# Patient Record
Sex: Male | Born: 1985
Health system: Southern US, Community
[De-identification: ages and names within clinical notes are randomized; demographics above are authoritative.]

## PROBLEM LIST (undated history)

## (undated) DIAGNOSIS — I10 Essential (primary) hypertension: Secondary | ICD-10-CM

---

## 2014-05-07 ENCOUNTER — Encounter (HOSPITAL_COMMUNITY): Payer: Self-pay | Admitting: Emergency Medicine

## 2014-05-07 ENCOUNTER — Emergency Department (HOSPITAL_COMMUNITY)
Admission: EM | Admit: 2014-05-07 | Discharge: 2014-05-07 | Disposition: A | Discharge status: Home / Self Care | Payer: BLUE CROSS/BLUE SHIELD | Attending: Emergency Medicine | Admitting: Emergency Medicine

## 2014-05-07 DIAGNOSIS — N390 Urinary tract infection, site not specified: Secondary | ICD-10-CM | POA: Diagnosis not present

## 2014-05-07 DIAGNOSIS — K6289 Other specified diseases of anus and rectum: Secondary | ICD-10-CM | POA: Insufficient documentation

## 2014-05-07 DIAGNOSIS — R300 Dysuria: Secondary | ICD-10-CM | POA: Diagnosis present

## 2014-05-07 LAB — URINALYSIS, ROUTINE W REFLEX MICROSCOPIC
Glucose, UA: NEGATIVE mg/dL
Ketones, ur: 15 mg/dL — AB
Nitrite: NEGATIVE
Protein, ur: 30 mg/dL — AB
SPECIFIC GRAVITY, URINE: 1.034 — AB (ref 1.005–1.030)
UROBILINOGEN UA: 1 mg/dL (ref 0.0–1.0)
pH: 6 (ref 5.0–8.0)

## 2014-05-07 LAB — URINE MICROSCOPIC-ADD ON

## 2014-05-07 MED ORDER — CEFTRIAXONE SODIUM 250 MG IJ SOLR
250.0000 mg | Freq: Once | INTRAMUSCULAR | Status: AC
  Administered 2014-05-07: 250 mg via INTRAMUSCULAR
  Filled 2014-05-07: qty 250

## 2014-05-07 MED ORDER — AZITHROMYCIN 250 MG PO TABS
1000.0000 mg | ORAL_TABLET | Freq: Once | ORAL | Status: AC
  Administered 2014-05-07: 1000 mg via ORAL
  Filled 2014-05-07: qty 4

## 2014-05-07 MED ORDER — PHENAZOPYRIDINE HCL 200 MG PO TABS: 200.0000 mg | ORAL_TABLET | Freq: Three times a day (TID) | ORAL | Status: DC

## 2014-05-07 MED ORDER — LIDOCAINE HCL (PF) 1 % IJ SOLN
1.0000 mL | Freq: Once | INTRAMUSCULAR | Status: AC
  Administered 2014-05-07: 1 mL
  Filled 2014-05-07: qty 5

## 2014-05-07 MED ORDER — CEPHALEXIN 500 MG PO CAPS: 500.0000 mg | ORAL_CAPSULE | Freq: Four times a day (QID) | ORAL | Status: DC

## 2014-05-07 NOTE — Discharge Instructions (Signed)
Please follow directions provided. Be sure to follow up with the urologist if your symptoms do not improve. Use the resource guide or the referral given to establish care with a primary care doctor. Be sure to drink plenty of water to stay well hydrated. Please take the antibiotic until it is all gone. Please use the Pyridium as needed for painful urination. Don't hesitate to return for any new, worsening, or concerning symptoms.   SEEK IMMEDIATE MEDICAL CARE IF:  You have severe back pain or lower abdominal pain.  You develop chills.  You have nausea or vomiting.  You have continued burning or discomfort with urination.    Emergency Department Resource Guide 1) Find a Doctor and Pay Out of Pocket Although you won't have to find out who is covered by your insurance plan, it is a good idea to ask around and get recommendations. You will then need to call the office and see if the doctor you have chosen will accept you as a new patient and what types of options they offer for patients who are self-pay. Some doctors offer discounts or will set up payment plans for their patients who do not have insurance, but you will need to ask so you aren't surprised when you get to your appointment.  2) Contact Your Local Health Department Not all health departments have doctors that can see patients for sick visits, but many do, so it is worth a call to see if yours does. If you don't know where your local health department is, you can check in your phone book. The CDC also has a tool to help you locate your state's health department, and many state websites also have listings of all of their local health departments.  3) Find a Walk-in Clinic If your illness is not likely to be very severe or complicated, you may want to try a walk in clinic. These are popping up all over the country in pharmacies, drugstores, and shopping centers. They're usually staffed by nurse practitioners or physician assistants that have  been trained to treat common illnesses and complaints. They're usually fairly quick and inexpensive. However, if you have serious medical issues or chronic medical problems, these are probably not your best option.  No Primary Care Doctor: - Call Health Connect at  [phone] - they can help you locate a primary care doctor that  accepts your insurance, provides certain services, etc. - Physician Referral Service- [phone]  Chronic Pain Problems: Organization         Address  Phone   Notes  [facility]  [phone] Patients need to be referred by their primary care doctor.   Medication Assistance: Organization         Address  Phone   Notes  [facility] [address] [place], [place] [id number] [phone] --Must be a resident of [place] -- Must have NO insurance coverage whatsoever (no Medicaid/ Medicare, etc.) -- The pt. MUST have a primary care doctor that directs their care regularly and follows them in the community   MedAssist  [phone]   [employer]  [phone]    Agencies that provide inexpensive medical care: Organization         Address  Phone   Notes  [facility]  [phone]   [facility]    [phone]   [facility] [address] [place], [place]  [id number] [phone]   [place] [address] [place] [phone]   Planned Parenthood    [phone]   [facility]    [phone]   [place] and [place] [address], [place] Phone:  [phone], Fax:  [phone] Hours of Operation:  9 am - 6 pm, M-F.  Also accepts Medicaid/Medicare and self-pay.  [facility] for [address], [address], [place] Phone: [phone], Fax: [phone]. Hours of Operation:  8:30 am - 5:30 pm, M-F.  Also accepts Medicaid and  self-pay.  [facility] [place] [address] [place] Phone: [phone]   [place], [place], [place] [phone], Ext. [phone] Mondays & Thursdays: 7-9 AM.  First 15 patients are seen on a first come, first serve basis.    [place]inn Providers:  Organization         Address  Phone   Notes  [facility] [address],  [phone] Also accepts self-pay patients.  [facility] [id number] [place], [place]  [phone]   [place], [address], [place] [phone]   [facility] [address], [place] [phone]   Lucianne Lei [address], [place]   [phone] Only accepts [place] Access [place] patients after they have their name applied to their card.   Self-Pay (no insurance) in [facility]:  Organization         Address  Phone   Notes  Sickle Cell Patients, [facility] [place] [phone]   [facility]gent Care [place] [phone]   [facility] [place]  [place], [place], [place] [phone]   [facility]/Dr. Osei-Bonsu  [address] [place] or [address], [place] [phone] Phone number for both [place] and [place] locations is the same.  Urgent Medical and [facility] [address], [place] [phone]   [facility] [address] [place] or [address] [phone]   [facility] [address] [place] [phone], phone; [phone], fax Sees patients 1st and 3rd Saturday of every month.  Must not qualify for public or private insurance (i.e. Medicaid, Medicare, [place] Health Choice, Veterans' Benefits)  Household income  should be no more than 200% of the poverty level The clinic cannot treat you if you are pregnant or think you are pregnant  Sexually transmitted diseases are not treated at the clinic.    Dental Care: Organization         Address  Phone  Notes  [facility] of [facility] [phone] Accepts children up to age 53 who are enrolled in [place] or [place]; pregnant women with a Medicaid card; and children who have applied for Medicaid or [place] Health Choice, but were declined, whose parents can pay a reduced fee at time of service.  [facility] Department of [facility]  [address], [place] [phone] Accepts children up to age 65 who are enrolled in [place] or [place]; pregnant women  with a Medicaid card; and children who have applied for Medicaid or [place] Health Choice, but were declined, whose parents can pay a reduced fee at time of service.  [place] Adult Dental Access PROGRAM  [place] [phone] Patients are seen by appointment only. Walk-ins are not accepted. [place] will see patients 21 years of age and older. Monday - Tuesday (8am-5pm) Most Wednesdays (8:30-5pm) $30 per visit, cash only  [facility] Adult Dental Access PROGRAM  [address], [facility] [phone] Patients are seen by appointment only. Walk-ins are not accepted. [place] will see patients 26 years of age and older. One Wednesday Evening (Monthly: Volunteer Based).  $30 per visit, cash only  [place]  [phone] for adults; Children under age 40, call Graduate Pediatric Dentistry at [phone]. Children aged 65-14, please call [phone] to request a pediatric application.  Dental services are provided in all areas of dental care including fillings, crowns and bridges, complete and partial dentures, implants, gum treatment,  root canals, and extractions. Preventive care is also provided. Treatment is provided to both adults and children. Patients are selected via a lottery and there is often a waiting list.   [facility] [address] [place]  [phone] [email or web address]   [facility] [address] [place], [place] [phone], Ext. [phone] Second and Fourth Thursday of each month, opens at 6:30 AM; Clinic ends at 9 AM.  Patients are seen on a first-come first-served basis, and a limited number are seen during each clinic.   [facility]  [address] Hillard Danker [place], [place] [phone]   Eligibility Requirements You must have lived in [place], [place], or [place] counties for at least the last three months.   You cannot be eligible for state or federal sponsored [employer], including [employer], [place], or [employer].   You generally cannot be eligible for healthcare insurance through your employer.    How to apply: Eligibility screenings are held every Tuesday and Wednesday afternoon from 1:00 pm until 4:00 pm. You do not need an appointment for the interview!  [facility] [address], [place], [place]   [place]  [place] Department  [place]  [phone]    Behavioral Health Resources in the Community: Intensive Outpatient Programs Organization         Address  Phone  Notes  [facility] [place]. [address] [place], [place] [phone]   [facility] Outpatient [address], [place], [place]   ADS: Alcohol [facility] Drug S[facility] [address] [place], [place]   [place] [address]  [place], [place] or [phone]   Substance Abuse Resources Organization         Address  Phone  Notes  Alcohol and Drug Services   [phone]   [place]  [phone]   [facility] [place]   Chinita Pester  [phone]   Residential & Outpatient Substance Abuse Program  [phone]   Psychological Services Organization         Address  Phone  Notes  [facility] [place]  [place]  [phone]   [place] [address] [place] or [phone]    Mobile Crisis Teams  Organization         Address  Phone  Notes  [facility], Mobile Crisis Care Unit  [phone]   [facility]  [address] [place], [place]   [facility] [address], [place] [phone]    Self-Help/Support Groups Organization         Address  Phone             Notes  [place]. of [place] - variety of support groups  [place] Call for more information  Narcotics Anonymous (NA), [facility] [address], [employer] [place]  2 meetings at this location   Special educational needs teacher         Address  Phone  Notes  ASAP Residential Treatment [place],    [place]  [phone]   [facility]  [address] [place] [phone], [place], [place]   Gem Brady, [place] [phone] Admissions: 8am-3pm M-F  Incentives Substance [place] [address]    [place], [place] [phone]   [facility] [address] [place], [place], [place]   [facility] [address],  [place], [place]   [facility] Programs - Intensive Outpatient [address] Krist[address], [place], [place]   [facility] [address].,  [place], [place] [phone] or [phone]   [facility] (RTS) [address], [place], [place] Accepts Medicaid  [facility] [place] [address],  [place] [phone] Substance Abuse/Addiction Treatment   [facility] Organization         Address  Phone  Notes  [facility]  [phone]   Domenic Schwab, PhD [address] Arlis Porta [place], [place]   [phone] or [phone]   [place], [place] [phone]   [facility] [address] [place], [place] [phone] Insurance/Medicaid/sponsorship through [facility] and Families [address]                                    [place], [place] [phone] [place] [address] [place], [place] [phone]    Dr. Adele Schilder  [phone]   [facility] Dept. 1) [address], [place] 2) [place] 3)  [address], [place] [phone]  [phone]   [place] [phone] or [phone] (After Hours)

## 2014-05-07 NOTE — ED Provider Notes (Signed)
CSN: [id number]     Arrival date & time 05/07/14  2002 History  This chart was scribed for non-physician practitioner, Harle BattiestElizabeth Terreon Ekholm, NP, working with Shon Batonourtney F Horton, MD, by Bronson CurbJacqueline Melvin, ED Scribe. This patient was seen in room TR08C/TR08C and the patient's care was started at 8:59 PM.   Chief Complaint  Patient presents with  . Urinary Tract Infection    The history is provided by the patient. No language interpreter was used.     HPI Comments: Wesley Watson is a 29 y.o. male who presents to the Emergency Department complaining of sudden onset painful urination for the past 2 days. There is associated generalized headache, subjective fever, chills. He notes occasional rectal pain with BMs that is gradually improving. Patient also notes  intermittent lower back pain, however, he states the pain is very mild and possibly related to work. Patient had takenTylenol and Nyquil in order to sleep at night. He denies STD exposure. He denies abdominal pain, nausea, vomiting, penile discharge, or testicular pain.   History reviewed. No pertinent past medical history. History reviewed. No pertinent past surgical history. No family history on file. History  Substance Use Topics  . Smoking status: Never Smoker   . Smokeless tobacco: Not on file  . Alcohol Use: No    Review of Systems  Gastrointestinal: Positive for rectal pain. Negative for nausea, vomiting and abdominal pain.  Genitourinary: Positive for dysuria. Negative for discharge and testicular pain.  Musculoskeletal: Positive for back pain.      Allergies  Review of patient's allergies indicates no known allergies.  Home Medications   Prior to Admission medications   Medication Sig Start Date End Date Taking? Authorizing Provider  acetaminophen (TYLENOL) 500 MG tablet Take 1,000 mg by mouth every 6 (six) hours as needed for mild pain.   Yes Historical Provider, MD  Pseudoeph-Doxylamine-DM-APAP (NYQUIL PO) Take 30 mLs  by mouth daily as needed (congestion / sleep).   Yes Historical Provider, MD   Triage Vitals: BP 139/112 mmHg  Pulse 81  Temp(Src) 98.1 F (36.7 C)  Resp 16  SpO2 100%  Physical Exam  Constitutional: He is oriented to person, place, and time. He appears well-developed and well-nourished. No distress.  HENT:  Head: Normocephalic and atraumatic.  Eyes: Conjunctivae and EOM are normal.  Neck: Neck supple. No tracheal deviation present.  Cardiovascular: Normal rate.   Pulmonary/Chest: Effort normal. No respiratory distress.  Abdominal: There is no CVA tenderness.  No CVA tenderness.  Genitourinary: Testes normal and penis normal. Right testis shows no tenderness. Left testis shows no tenderness. Uncircumcised. No discharge found.  Not circumcised, but normal penis. No discharge. No testicular tenderness. No lymphadenopathy.   Musculoskeletal: Normal range of motion.  Lymphadenopathy:       Right: No inguinal adenopathy present.       Left: No inguinal adenopathy present.  Neurological: He is alert and oriented to person, place, and time.  Skin: Skin is warm and dry.  Psychiatric: He has a normal mood and affect. His behavior is normal.  Nursing note and vitals reviewed.   ED Course  Procedures (including critical care time)  DIAGNOSTIC STUDIES: Oxygen Saturation is 100% on room air, normal by my interpretation.    COORDINATION OF CARE: At 2104 Discussed treatment plan with patient which includes UA and possible urology f/u. Patient agrees.   Labs Review Labs Reviewed  URINALYSIS, ROUTINE W REFLEX MICROSCOPIC    Imaging Review No results found.   EKG  Interpretation None      MDM   Final diagnoses:  UTI (lower urinary tract infection)   29 yo with dysuria but no report of testicular pain or penile discharge.  Pt denies possible STD exposure. His urine shows leukocytes and 21-50 WBC. Pt covered in ED for STD, but will be treated for UTI. He is afebrile, no CVA  tenderness, normotensive, and denies N/V. Pt to be dc home with antibiotics and instructions to follow up with urology if symptoms persist. Pt aware of plan and in agreement.   I personally performed the services described in this documentation, which was scribed in my presence. The recorded information has been reviewed and is accurate.  Filed Vitals:   05/07/14 2020 05/07/14 2036 05/07/14 2249  BP: 139/112 141/86 142/85  Pulse: 81  80  Temp: 98.1 F (36.7 C)  97.4 F (36.3 C)  TempSrc:   Oral  Resp: 16  16  SpO2: 100%  99%   Meds given in ED:  Medications  cefTRIAXone (ROCEPHIN) injection 250 mg (250 mg Intramuscular Given 05/07/14 2239)  azithromycin (ZITHROMAX) tablet 1,000 mg (1,000 mg Oral Given 05/07/14 2238)  lidocaine (PF) (XYLOCAINE) 1 % injection 1 mL (1 mL Other Given 05/07/14 2238)    Discharge Medication List as of 05/07/2014 10:46 PM    START taking these medications   Details  cephALEXin (KEFLEX) 500 MG capsule Take 1 capsule (500 mg total) by mouth 4 (four) times daily., Starting 05/07/2014, Until Discontinued, Print    phenazopyridine (PYRIDIUM) 200 MG tablet Take 1 tablet (200 mg total) by mouth 3 (three) times daily., Starting 05/07/2014, Until Discontinued, Print          Harle Battiest, NP 05/09/14 7829  Shon Baton, MD 05/12/14 Ernestina [place]

## 2014-05-07 NOTE — ED Notes (Signed)
Pt. reports dysuria and concentrated urine onset last week with low grade fever and mild headache .

## 2014-05-07 NOTE — ED Notes (Signed)
Pt c/o burning on urination since Monday. Also c/o headache and fever. Has been taking Tylenol for headache and fever.

## 2019-04-15 ENCOUNTER — Encounter (HOSPITAL_COMMUNITY): Payer: Self-pay

## 2019-04-15 ENCOUNTER — Ambulatory Visit (HOSPITAL_COMMUNITY)
Admission: EM | Admit: 2019-04-15 | Discharge: 2019-04-15 | Disposition: A | Discharge status: Home / Self Care | Payer: No Typology Code available for payment source | Attending: Family Medicine | Admitting: Family Medicine

## 2019-04-15 DIAGNOSIS — I10 Essential (primary) hypertension: Secondary | ICD-10-CM

## 2019-04-15 DIAGNOSIS — T148XXA Other injury of unspecified body region, initial encounter: Secondary | ICD-10-CM | POA: Diagnosis not present

## 2019-04-15 HISTORY — DX: Essential (primary) hypertension: I10

## 2019-04-15 MED ORDER — AMLODIPINE BESYLATE 5 MG PO TABS: 5.0000 mg | ORAL_TABLET | Freq: Every day | ORAL | 0 refills | Status: DC

## 2019-04-15 NOTE — Discharge Instructions (Addendum)
We removed the splinter from your finger.  Keep clean and watch for signs of infection. We have started you on amlodipine 5 mg please follow-up with primary care for further management of your high blood pressure.

## 2019-04-15 NOTE — ED Triage Notes (Signed)
Pt states yesterday he got a splinter in his left 2nd digit. It's a very small splinter.

## 2019-04-16 NOTE — ED Provider Notes (Addendum)
[facility]    CSN: [id number] Arrival date & time: 04/15/19  9323      History   Chief Complaint Chief Complaint  Patient presents with  . splinter in finger    HPI Wesley Watson is a 34 y.o. male.   Patient is a 34 year old male presents today with splinter in left second digit.  This is located to the tip of his finger.  This occurred yesterday.  Attempted to remove but piece broke off.  He has been cleaning the finger.  Mild pain but no numbness, tingling or decrease in station.  No swelling.  Patient also here for refill on blood pressure medication.  He has not had his medication in approximately 2 months.  Blood pressure 175/120 today.  Recently obtained medical insurance and plans to see a primary care for further management.  Denies any headache, dizziness, blurred vision.   ROS per HPI      Past Medical History:  Diagnosis Date  . Hypertension     There are no problems to display for this patient.   History reviewed. No pertinent surgical history.     Home Medications    Prior to Admission medications   Medication Sig Start Date End Date Taking? Authorizing Provider  acetaminophen (TYLENOL) 500 MG tablet Take 1,000 mg by mouth every 6 (six) hours as needed for mild pain.    [provider]  amLODipine (NORVASC) 5 MG tablet Take 1 tablet (5 mg total) by mouth daily. 04/15/19   Janace Aris, NP    Family History Family History  Problem Relation Age of Onset  . Hypertension Mother     Social History Social History   Tobacco Use  . Smoking status: Never Smoker  Substance Use Topics  . Alcohol use: No  . Drug use: No     Allergies   Patient has no known allergies.   Review of Systems Review of Systems   Physical Exam Triage Vital Signs ED Triage Vitals [04/15/19 0943]  Enc Vitals Group     BP (!) 175/120     Pulse Rate 77     Resp 18     Temp 99 F (37.2 C)     Temp Source Oral     SpO2 98 %     Weight 280  lb (127 kg)     Height 5\' 11"  (1.803 m)     Head Circumference      Peak Flow      Pain Score 0     Pain Loc      Pain Edu?      Excl. in GC?    No data found.  Updated Vital Signs BP (!) 175/120   Pulse 77   Temp 99 F (37.2 C) (Oral)   Resp 18   Ht 5\' 11"  (1.803 m)   Wt 280 lb (127 kg)   SpO2 98%   BMI 39.05 kg/m   Visual Acuity Right Eye Distance:   Left Eye Distance:   Bilateral Distance:    Right Eye Near:   Left Eye Near:    Bilateral Near:     Physical Exam Vitals and nursing note reviewed.  Constitutional:      Appearance: Normal appearance.  HENT:     Head: Normocephalic and atraumatic.     Nose: Nose normal.  Eyes:     Conjunctiva/sclera: Conjunctivae normal.  Pulmonary:     Effort: Pulmonary effort is normal.  Abdominal:  Palpations: Abdomen is soft.     Tenderness: There is no abdominal tenderness.  Musculoskeletal:        General: Normal range of motion.     Cervical back: Normal range of motion.  Skin:    General: Skin is warm and dry.     Comments: Tip of sliver visible to tip of the left second digit.  No bleeding.   Neurological:     Mental Status: He is alert.  Psychiatric:        Mood and Affect: Mood normal.      UC Treatments / Results  Labs (all labs ordered are listed, but only abnormal results are displayed) Labs Reviewed - No data to display  EKG   Radiology No results found.  Procedures Procedures (including critical care time)  Medications Ordered in UC Medications - No data to display  Initial Impression / Assessment and Plan / UC Course  I have reviewed the triage vital signs and the nursing notes.  Pertinent labs & imaging results that were available during my care of the patient were reviewed by me and considered in my medical decision making (see chart for details).     Sliver- tiny piece removed from the finger. Pt tolerated well. Placed abx ointment and Band-Aid. Keep area clean.   Essential  hypertension-  Patient very hypertensive today at 175/120.  He is not having any concerning signs or symptoms on exam Patient has plans to follow-up primary care for blood pressure management.  We will restart him on amlodipine and have him follow-up Recommend monitor blood pressures at home and keep a log Watch sodium intake Contact given for primary care follow-up  Final Clinical Impressions(s) / UC Diagnoses   Final diagnoses:  Sliver  Essential hypertension     Discharge Instructions     We removed the splinter from your finger.  Keep clean and watch for signs of infection. We have started you on amlodipine 5 mg please follow-up with primary care for further management of your high blood pressure.    ED Prescriptions    Medication Sig Dispense Auth. Provider   amLODipine (NORVASC) 5 MG tablet Take 1 tablet (5 mg total) by mouth daily. 30 tablet Loura Halt A, NP     PDMP not reviewed this encounter.   Orvan July, NP 04/16/19 1102    Loura Halt A, NP 04/16/19 1103

## 2019-06-24 ENCOUNTER — Emergency Department (HOSPITAL_COMMUNITY): Payer: No Typology Code available for payment source

## 2019-06-24 ENCOUNTER — Other Ambulatory Visit: Payer: Self-pay

## 2019-06-24 ENCOUNTER — Emergency Department (HOSPITAL_COMMUNITY)
Admission: EM | Admit: 2019-06-24 | Discharge: 2019-06-24 | Disposition: A | Discharge status: Home / Self Care | Payer: No Typology Code available for payment source | Attending: Emergency Medicine | Admitting: Emergency Medicine

## 2019-06-24 ENCOUNTER — Encounter (HOSPITAL_COMMUNITY): Payer: Self-pay

## 2019-06-24 DIAGNOSIS — N201 Calculus of ureter: Secondary | ICD-10-CM | POA: Insufficient documentation

## 2019-06-24 DIAGNOSIS — Z79899 Other long term (current) drug therapy: Secondary | ICD-10-CM | POA: Insufficient documentation

## 2019-06-24 DIAGNOSIS — I10 Essential (primary) hypertension: Secondary | ICD-10-CM | POA: Insufficient documentation

## 2019-06-24 DIAGNOSIS — R1031 Right lower quadrant pain: Secondary | ICD-10-CM | POA: Diagnosis present

## 2019-06-24 LAB — COMPREHENSIVE METABOLIC PANEL
ALT: 27 U/L (ref 0–44)
AST: 27 U/L (ref 15–41)
Albumin: 4.5 g/dL (ref 3.5–5.0)
Alkaline Phosphatase: 55 U/L (ref 38–126)
Anion gap: 11 (ref 5–15)
BUN: 19 mg/dL (ref 6–20)
CO2: 26 mmol/L (ref 22–32)
Calcium: 9.2 mg/dL (ref 8.9–10.3)
Chloride: 100 mmol/L (ref 98–111)
Creatinine, Ser: 1.42 mg/dL — ABNORMAL HIGH (ref 0.61–1.24)
GFR calc Af Amer: >60 mL/min (ref >60)
GFR calc non Af Amer: >60 mL/min (ref >60)
Glucose, Bld: 157 mg/dL — ABNORMAL HIGH (ref 70–99)
Potassium: 4.1 mmol/L (ref 3.5–5.1)
Sodium: 137 mmol/L (ref 135–145)
Total Bilirubin: 0.7 mg/dL (ref 0.3–1.2)
Total Protein: 7.5 g/dL (ref 6.5–8.1)

## 2019-06-24 LAB — URINALYSIS, ROUTINE W REFLEX MICROSCOPIC
Bacteria, UA: NONE SEEN
Bilirubin Urine: NEGATIVE
Glucose, UA: 150 mg/dL — AB
Ketones, ur: 5 mg/dL — AB
Leukocytes,Ua: NEGATIVE
Nitrite: NEGATIVE
Protein, ur: NEGATIVE mg/dL
Specific Gravity, Urine: 1.013 (ref 1.005–1.030)
pH: 8 (ref 5.0–8.0)

## 2019-06-24 LAB — CBC
HCT: 49.2 % (ref 39.0–52.0)
Hemoglobin: 15.7 g/dL (ref 13.0–17.0)
MCH: 26.7 pg (ref 26.0–34.0)
MCHC: 31.9 g/dL (ref 30.0–36.0)
MCV: 83.7 fL (ref 80.0–100.0)
Platelets: 266 10*3/uL (ref 150–400)
RBC: 5.88 MIL/uL — ABNORMAL HIGH (ref 4.22–5.81)
RDW: 13.6 % (ref 11.5–15.5)
WBC: 8.1 10*3/uL (ref 4.0–10.5)
nRBC: 0 % (ref 0.0–0.2)

## 2019-06-24 LAB — LIPASE, BLOOD: Lipase: 25 U/L (ref 11–51)

## 2019-06-24 MED ORDER — HYDROMORPHONE HCL 1 MG/ML IJ SOLN
1.0000 mg | Freq: Once | INTRAMUSCULAR | Status: AC
  Administered 2019-06-24: 1 mg via INTRAVENOUS
  Filled 2019-06-24: qty 1

## 2019-06-24 MED ORDER — SODIUM CHLORIDE 0.9 % IV BOLUS (SEPSIS)
1000.0000 mL | Freq: Once | INTRAVENOUS | Status: AC
  Administered 2019-06-24: 1000 mL via INTRAVENOUS

## 2019-06-24 MED ORDER — HYDROCODONE-ACETAMINOPHEN 5-325 MG PO TABS: 1.0000 | ORAL_TABLET | ORAL | 0 refills | Status: DC | PRN

## 2019-06-24 MED ORDER — ONDANSETRON 8 MG PO TBDP: 8.0000 mg | ORAL_TABLET | Freq: Three times a day (TID) | ORAL | 0 refills | Status: DC | PRN

## 2019-06-24 MED ORDER — SODIUM CHLORIDE 0.9% FLUSH
3.0000 mL | Freq: Once | INTRAVENOUS | Status: AC
  Administered 2019-06-24: 3 mL via INTRAVENOUS

## 2019-06-24 MED ORDER — ONDANSETRON HCL 4 MG/2ML IJ SOLN
4.0000 mg | Freq: Once | INTRAMUSCULAR | Status: AC
Start: 2019-06-24 — End: 2019-06-24
  Administered 2019-06-24: 4 mg via INTRAVENOUS
  Filled 2019-06-24: qty 2

## 2019-06-24 MED ORDER — SODIUM CHLORIDE 0.9 % IV SOLN: 1000.0000 mL | INTRAVENOUS | Status: DC

## 2019-06-24 MED FILL — ONDANSETRON ODT 8 MG TABLET: 8 | 4 days supply | Qty: 12 | Fill #0

## 2019-06-24 NOTE — ED Provider Notes (Signed)
[facility]-EMERGENCY DEPT Provider Note   CSN: [id number] Arrival date & time: 06/24/19  8469     History Chief Complaint  Patient presents with  . Abdominal Pain    Wesley Watson is a 34 y.o. male.  The history is provided by the patient.  Abdominal Pain Pain location:  RLQ Pain quality comment:  Awueezing Pain radiates to:  Does not radiate Pain severity:  Severe Onset quality:  Sudden Duration:  5 hours Timing:  Constant Chronicity:  New Context: not recent illness and not trauma   Relieved by:  Nothing Ineffective treatments:  None tried Associated symptoms: nausea   Associated symptoms: no constipation, no diarrhea, no dysuria, no fever and no vomiting        Past Medical History:  Diagnosis Date  . Hypertension     There are no problems to display for this patient.   History reviewed. No pertinent surgical history.     Family History  Problem Relation Age of Onset  . Hypertension Mother     Social History   Tobacco Use  . Smoking status: Never Smoker  Substance Use Topics  . Alcohol use: No  . Drug use: No    Home Medications Prior to Admission medications   Medication Sig Start Date End Date Taking? Authorizing Provider  acetaminophen (TYLENOL) 500 MG tablet Take 1,000 mg by mouth every 6 (six) hours as needed for mild pain.    [provider]  amLODipine (NORVASC) 5 MG tablet Take 1 tablet (5 mg total) by mouth daily. 04/15/19   Dahlia Byes A, NP  HYDROcodone-acetaminophen (NORCO/VICODIN) 5-325 MG tablet Take 1 tablet by mouth every 4 (four) hours as needed. 06/24/19   Linwood Dibbles, MD  ondansetron (ZOFRAN ODT) 8 MG disintegrating tablet Take 1 tablet (8 mg total) by mouth every 8 (eight) hours as needed for nausea or vomiting. 06/24/19   Linwood Dibbles, MD    Allergies    Patient has no known allergies.  Review of Systems   Review of Systems  Constitutional: Negative for fever.  Gastrointestinal: Positive for  abdominal pain and nausea. Negative for constipation, diarrhea and vomiting.  Genitourinary: Negative for dysuria.    Physical Exam Updated Vital Signs BP 136/80   Pulse 80   Resp 16   SpO2 99%   Physical Exam  ED Results / Procedures / Treatments   Labs (all labs ordered are listed, but only abnormal results are displayed) Labs Reviewed  COMPREHENSIVE METABOLIC PANEL - Abnormal; Notable for the following components:      Result Value   Glucose, Bld 157 (*)    Creatinine, Ser 1.42 (*)    All other components within normal limits  CBC - Abnormal; Notable for the following components:   RBC 5.88 (*)    All other components within normal limits  URINALYSIS, ROUTINE W REFLEX MICROSCOPIC - Abnormal; Notable for the following components:   Color, Urine STRAW (*)    Glucose, UA 150 (*)    Hgb urine dipstick SMALL (*)    Ketones, ur 5 (*)    All other components within normal limits  LIPASE, BLOOD    EKG EKG Interpretation  Date/Time:  Monday June 24 2019 07:05:11 EDT Ventricular Rate:  84 PR Interval:    QRS Duration: 114 QT Interval:  384 QTC Calculation: 454 R Axis:   21 Text Interpretation: Sinus rhythm Prolonged PR interval Borderline intraventricular conduction delay Low voltage, precordial leads ST elev, probable normal  early repol pattern No old tracing to compare Confirmed by Dorie Rank [phone]) on 06/24/2019 9:05:06 AM   Radiology CT Renal Stone Study  Result Date: 06/24/2019 CLINICAL DATA:  Right lower abdominal pain radiating into the back for approximately 2 hours. EXAM: CT ABDOMEN AND PELVIS WITHOUT CONTRAST TECHNIQUE: Multidetector CT imaging of the abdomen and pelvis was performed following the standard protocol without IV contrast. COMPARISON:  None. FINDINGS: Lower chest: Lung bases clear.  No pleural or pericardial effusion. Hepatobiliary: No focal liver abnormality is seen. No gallstones, gallbladder wall thickening, or biliary dilatation. Pancreas:  Unremarkable. No pancreatic ductal dilatation or surrounding inflammatory changes. Spleen: Normal in size without focal abnormality. Adrenals/Urinary Tract: The patient has mild right hydronephrosis with stranding about the right kidney and ureter due to a 0.3 cm stone just inferior to the ureteropelvic junction. Single punctate nonobstructing stones are also seen in each kidney. No left hydronephrosis or ureteral stone. Urinary bladder is unremarkable. The adrenal glands appear normal. Stomach/Bowel: Stomach is within normal limits. Appendix appears normal. No evidence of bowel wall thickening, distention, or inflammatory changes. Vascular/Lymphatic: No significant vascular findings are present. No enlarged abdominal or pelvic lymph nodes. Reproductive: Prostate is unremarkable. Other: Small fat containing umbilical hernia is noted. Musculoskeletal: No acute or focal bony abnormality. IMPRESSION: Mild right hydronephrosis due to a 0.3 cm stone just inferior to the ureteropelvic junction. Single punctate nonobstructing stones are also seen in each kidney. Small fat containing umbilical hernia. Electronically Signed   By: Inge Rise M.D.   On: 06/24/2019 11:41    Procedures Procedures (including critical care time)  Medications Ordered in ED Medications  sodium chloride 0.9 % bolus 1,000 mL (0 mLs Intravenous Stopped 06/24/19 1029)    Followed by  0.9 %  sodium chloride infusion (has no administration in time range)  sodium chloride flush (NS) 0.9 % injection 3 mL (3 mLs Intravenous Given 06/24/19 0917)  HYDROmorphone (DILAUDID) injection 1 mg (1 mg Intravenous Given 06/24/19 0917)  ondansetron (ZOFRAN) injection 4 mg (4 mg Intravenous Given 06/24/19 3500)    ED Course  I have reviewed the triage vital signs and the nursing notes.  Pertinent labs & imaging results that were available during my care of the patient were reviewed by me and considered in my medical decision making (see chart for  details).  Clinical Course as of Jun 23 1237  Mon Jun 24, 2019  1036 Laboratory tests normal with the exception of elevated creatinine.  No previous for comparison.  Symptoms concerning for kidney stone.  We will proceed with CT scan   [JK]  1209 0.3 cm ureteral stone noted.   [JK]  1209 Hematuria noted.  No signs of infection.  Urinalysis, Routine w reflex microscopic(!) [JK]    Clinical Course User Index [JK] Dorie Rank, MD   MDM Rules/Calculators/A&P                          Patient's ED work-up was notable for hematuria.  Labs otherwise reassuring.  Patient's presentation was suggestive of ureteral colic.  Abdominal aortic aneurysm and appendicitis less likely.  CT scan confirms ureteral stone.  Patient was treated with pain medications and his symptoms have improved.  Plan on discharge with prescription for pain medications.  Discussed use of urine strainer.  Outpatient follow-up with urology as needed. Final Clinical Impression(s) / ED Diagnoses Final diagnoses:  Right ureteral stone    Rx / DC Orders ED Discharge  Orders         Ordered    HYDROcodone-acetaminophen (NORCO/VICODIN) 5-325 MG tablet  Every 4 hours PRN     Discontinue  Reprint     06/24/19 1237    ondansetron (ZOFRAN ODT) 8 MG disintegrating tablet  Every 8 hours PRN     Discontinue  Reprint     06/24/19 1237           Linwood Dibbles, MD 06/24/19 1240

## 2019-06-24 NOTE — ED Triage Notes (Signed)
Patient arrived with complaints of right lower abdominal pain that started roughly 2 hours ago. States he has been working out more recently and admits to poor water intake. Pain also radiates to back, denies any urinary symptoms or history of kidney stone.

## 2019-06-24 NOTE — Discharge Instructions (Addendum)
Take the medications as needed for pain.  You can also take over-the-counter ibuprofen or naproxen.  Use a urine strainer to check to see if the stone passes.  Follow-up with your urologist if the symptoms persist.

## 2019-06-24 NOTE — ED Notes (Signed)
Patient moved to triage room for EKG when asking for permission to put stickers on his chest patient states he laid himself down on the floor. Patient states he is being aggressive because he is in pain, this nurse verbalized understanding that he is in pain but to try and allow [place] to help him. Patient agreed to EKG.

## 2019-06-24 NOTE — ED Notes (Signed)
Patient fell asleep and snoring during EKG

## 2019-06-24 NOTE — ED Notes (Signed)
Patient laying in floor of waiting room and refusing to get up. States "it feels better down here." This nurse could not get patient to move or answer any questions. When patient agreed to move to a chair he snatched his emesis and threw it. Patient gritting teeth and appear angry with this nurse for moving patient off floor.

## 2019-08-16 MED FILL — AMLODIPINE-VALSARTAN 5-320: 5-320 | 60 days supply | Qty: 60 | Fill #1

## 2019-10-18 ENCOUNTER — Other Ambulatory Visit (HOSPITAL_COMMUNITY): Payer: Self-pay | Admitting: Family Medicine

## 2019-10-18 MED FILL — AMLODIPINE-VALSARTAN 5-320: 5-320 | 90 days supply | Qty: 90 | Fill #0

## 2020-01-13 MED FILL — AMLODIPINE-VALSARTAN 5-320: 5-320 | 90 days supply | Qty: 90 | Fill #1

## 2020-02-03 ENCOUNTER — Other Ambulatory Visit: Payer: No Typology Code available for payment source

## 2020-02-03 DIAGNOSIS — Z20822 Contact with and (suspected) exposure to covid-19: Secondary | ICD-10-CM

## 2020-02-04 LAB — NOVEL CORONAVIRUS, NAA: SARS-CoV-2, NAA: NOT DETECTED

## 2020-02-04 LAB — SARS-COV-2, NAA 2 DAY TAT

## 2020-04-21 ENCOUNTER — Other Ambulatory Visit (HOSPITAL_COMMUNITY): Payer: Self-pay

## 2020-04-21 MED ORDER — AMLODIPINE BESYLATE-VALSARTAN 5-320 MG PO TABS
1.0000 | ORAL_TABLET | Freq: Every day | ORAL | 3 refills | Status: DC
  Filled 2020-04-21 – 2020-05-04 (×2): qty 90, 90d supply, fill #0
  Filled 2020-08-07: qty 90, 90d supply, fill #1
  Filled 2020-10-25 – 2020-11-19 (×2): qty 90, 90d supply, fill #2
  Filled 2021-03-02: qty 90, 90d supply, fill #3

## 2020-04-23 ENCOUNTER — Other Ambulatory Visit (HOSPITAL_COMMUNITY): Payer: Self-pay

## 2020-04-23 MED FILL — Amlodipine Besylate-Valsartan Tab 5-320 MG: ORAL | 90 days supply | Qty: 90 | Fill #0 | Status: CN

## 2020-04-29 ENCOUNTER — Other Ambulatory Visit (HOSPITAL_COMMUNITY): Payer: Self-pay

## 2020-04-29 MED FILL — Amlodipine Besylate-Valsartan Tab 5-320 MG: ORAL | 90 days supply | Qty: 90 | Fill #0 | Status: CN

## 2020-05-01 ENCOUNTER — Other Ambulatory Visit (HOSPITAL_COMMUNITY): Payer: Self-pay

## 2020-05-04 ENCOUNTER — Other Ambulatory Visit (HOSPITAL_COMMUNITY): Payer: Self-pay

## 2020-08-07 ENCOUNTER — Other Ambulatory Visit (HOSPITAL_COMMUNITY): Payer: Self-pay

## 2020-10-26 ENCOUNTER — Other Ambulatory Visit (HOSPITAL_COMMUNITY): Payer: Self-pay

## 2020-11-03 ENCOUNTER — Other Ambulatory Visit (HOSPITAL_COMMUNITY): Payer: Self-pay

## 2020-11-19 ENCOUNTER — Other Ambulatory Visit (HOSPITAL_COMMUNITY): Payer: Self-pay

## 2021-03-02 ENCOUNTER — Ambulatory Visit (INDEPENDENT_AMBULATORY_CARE_PROVIDER_SITE_OTHER): Payer: No Typology Code available for payment source | Admitting: Family Medicine

## 2021-03-02 ENCOUNTER — Other Ambulatory Visit (HOSPITAL_COMMUNITY): Payer: Self-pay

## 2021-03-02 ENCOUNTER — Other Ambulatory Visit: Payer: Self-pay

## 2021-03-02 DIAGNOSIS — Z0000 Encounter for general adult medical examination without abnormal findings: Secondary | ICD-10-CM

## 2021-03-02 DIAGNOSIS — I10 Essential (primary) hypertension: Secondary | ICD-10-CM

## 2021-03-02 MED ORDER — AMLODIPINE BESYLATE-VALSARTAN 5-320 MG PO TABS: 1.0000 | ORAL_TABLET | Freq: Every day | ORAL | 1 refills | Status: DC

## 2021-03-02 NOTE — Assessment & Plan Note (Signed)
By history, has had some recent elevations in blood pressure at home since running out of medication, will provide refill today Recommend intermittent monitoring of blood pressure at home, recommend DASH diet Recommend regular aerobic exercise, working towards healthy, gradual weight loss On refilling medication, it does appear that combination pill may not be covered well by his current insurance, advised patient on this and if not covered, may need to consider alternative such as resuming amlodipine on its own, possibly resuming both medications in noncombined form

## 2021-03-02 NOTE — Progress Notes (Signed)
New Patient Office Visit  Subjective:  Patient ID: Wesley Watson, male    DOB: 1985/08/14  Age: 36 y.o. MRN: [id number]  CC:  Chief Complaint  Patient presents with   New Patient (Initial Visit)    Patient presents today to establish care. He would also like to discuss blood pressure medication. He is a Producer, television/film/video. He is completely out of amlodipine.     HPI Wesley Watson is a 36 year old male presenting to establish in clinic.  He has current concerns as outlined above.  He reports past medical history of hypertension.  Hypertension: Reports that this was initially diagnosed around 2011.  Current medication has included amlodipine-valsartan, indicates that he has been on this medication for about 2 to 3 years.  Did run out about a week or so ago.  Does check blood pressure occasionally at home, has been elevated recently with running out of medication.  Denies any issues with chest pain, headaches, lightheadedness or dizziness.  Does report some family history of hypertension, diabetes, heart disease  Patient is originally from [place], has been living in [place] since about 36 years old, moved to the [place] area in 2019.  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  Outside of work he enjoys exercising, going to movies, spending time with girlfriend, traveling.  Past Medical History:  Diagnosis Date   Hypertension     No past surgical history on file.  Family History  Problem Relation Age of Onset   Hypertension Mother     Social History   Socioeconomic History   Marital status: Single    Spouse name: Not on file   Number of children: Not on file   Years of education: Not on file   Highest education level: Not on file  Occupational History   Not on file  Tobacco Use   Smoking status: Never   Smokeless tobacco: Not on file  Substance and Sexual Activity   Alcohol use: No   Drug use: No    Sexual activity: Not on file  Other Topics Concern   Not on file  Social History Narrative   Not on file   Social Determinants of Health   Financial Resource Strain: Not on file  Food Insecurity: Not on file  Transportation Needs: Not on file  Physical Activity: Not on file  Stress: Not on file  Social Connections: Not on file  Intimate Partner Violence: Not on file    Objective:   Today's Vitals: BP 128/82   Pulse 74   Ht 5\' 10"  (1.778 m)   Wt 281 lb (127.5 kg)   SpO2 98%   BMI 40.32 kg/m   Physical Exam  36 year old male in no acute distress Cardiovascular exam with regular rate and rhythm, no murmur appreciated Lungs clear to auscultation bilaterally  Assessment & Plan:   Problem List Items Addressed This Visit       Cardiovascular and Mediastinum   Hypertension    By history, has had some recent elevations in blood pressure at home since running out of medication, will provide refill today Recommend intermittent monitoring of blood pressure at home, recommend DASH diet Recommend regular aerobic exercise, working towards healthy, gradual weight loss On refilling medication, it does appear that combination pill may not be covered well by his current insurance, advised patient on this and if not covered, may need to consider alternative such as resuming amlodipine  on its own, possibly resuming both medications in noncombined form      Relevant Medications   amLODipine-valsartan (EXFORGE) 5-320 MG tablet   Other Visit Diagnoses     Wellness examination       Relevant Orders   CBC with Differential/Platelet   Comprehensive metabolic panel   Hemoglobin A1c   Lipid panel   TSH Rfx on Abnormal to Free T4       Outpatient Encounter Medications as of 03/02/2021  Medication Sig   amLODipine-valsartan (EXFORGE) 5-320 MG tablet Take 1 tablet by mouth daily.   [DISCONTINUED] acetaminophen (TYLENOL) 500 MG tablet Take 1,000 mg by mouth every 6 (six) hours as  needed for mild pain.   [DISCONTINUED] amLODipine (NORVASC) 5 MG tablet Take 1 tablet (5 mg total) by mouth daily.   [DISCONTINUED] amLODipine-valsartan (EXFORGE) 5-320 MG tablet TAKE 1 TABLET BY MOUTH ONCE DAILY   [DISCONTINUED] amLODipine-valsartan (EXFORGE) 5-320 MG tablet Take 1 tablet by mouth daily.   [DISCONTINUED] HYDROcodone-acetaminophen (NORCO/VICODIN) 5-325 MG tablet Take 1 tablet by mouth every 4 (four) hours as needed.   [DISCONTINUED] ondansetron (ZOFRAN ODT) 8 MG disintegrating tablet Take 1 tablet (8 mg total) by mouth every 8 (eight) hours as needed for nausea or vomiting.   No facility-administered encounter medications on file as of 03/02/2021.    Follow-up: Return in about 6 weeks (around 04/13/2021).  Plan for follow-up in about 6 weeks for wellness exam, nurse visit for labs 1 week prior  Durant Scibilia J De Peru, MD

## 2021-03-02 NOTE — Patient Instructions (Signed)
°  Medication Instructions:  Your physician recommends that you continue on your current medications as directed. Please refer to the Current Medication list given to you today.Amplodipine --If you need a refill on any your medications before your next appointment, please call your pharmacy first. If no refills are authorized on file call the office.--   Follow-Up: Your next appointment:  1week nurse visit prior to appointment Your physician recommends that you schedule a follow-up appointment in: 6-8  CPEweeks with Dr. de Peru  You will receive a text message or e-mail with a link to a survey about your care and experience with [place] today! We would greatly appreciate your feedback!   Thanks for letting us be apart of your health journey!!  Primary Care and Sports Medicine   Dr. Ceasar Mons Peru   We encourage you to activate your patient portal called "MyChart".  Sign up information is provided on this After Visit Summary.  MyChart is used to connect with patients for Virtual Visits (Telemedicine).  Patients are able to view lab/test results, encounter notes, upcoming appointments, etc.  Non-urgent messages can be sent to your provider as well. To learn more about what you can do with MyChart, please visit --  [email or web address].

## 2021-03-04 ENCOUNTER — Encounter (HOSPITAL_BASED_OUTPATIENT_CLINIC_OR_DEPARTMENT_OTHER): Payer: Self-pay | Admitting: Family Medicine

## 2021-04-13 ENCOUNTER — Ambulatory Visit (INDEPENDENT_AMBULATORY_CARE_PROVIDER_SITE_OTHER): Payer: No Typology Code available for payment source | Admitting: Family Medicine

## 2021-04-13 ENCOUNTER — Other Ambulatory Visit (HOSPITAL_COMMUNITY): Payer: Self-pay

## 2021-04-13 DIAGNOSIS — Z0000 Encounter for general adult medical examination without abnormal findings: Secondary | ICD-10-CM | POA: Diagnosis not present

## 2021-04-13 MED ORDER — AMLODIPINE BESYLATE-VALSARTAN 5-320 MG PO TABS
1.0000 | ORAL_TABLET | Freq: Every day | ORAL | 1 refills | Status: DC
  Filled 2021-04-13 – 2021-05-13 (×5): qty 90, 90d supply, fill #0
  Filled 2021-08-12: qty 90, 90d supply, fill #1

## 2021-04-13 NOTE — Patient Instructions (Signed)
?  Medication Instructions:  ?Your physician recommends that you continue on your current medications as directed. Please refer to the Current Medication list given to you today. ?--If you need a refill on any your medications before your next appointment, please call your pharmacy first. If no refills are authorized on file call the office.-- ?Lab Work: ?Your physician has recommended that you have lab work today: Full panel today will call with results.  ?If you have labs (blood work) drawn today and your tests are completely normal, you will receive your results via MyChart message OR a phone call from our staff.  ?Please ensure you check your voicemail in the event that you authorized detailed messages to be left on a delegated number. If you have any lab test that is abnormal or we need to change your treatment, we will call you to review the results. ? ? ? ?Follow-Up: ?Your next appointment:   ?Your physician recommends that you schedule a follow-up appointment in: 6 month follow up with Dr. Tommi Rumps Peru ? ?You will receive a text message or e-mail with a link to a survey about your care and experience with [place] today! We would greatly appreciate your feedback!  ? ?Thanks for letting us be apart of your health journey!!  ?Primary Care and Sports Medicine  ? ?Dr. Marcy Salvo de [place]  ? ?We encourage you to activate your patient portal called "MyChart".  Sign up information is provided on this After Visit Summary.  MyChart is used to connect with patients for Virtual Visits (Telemedicine).  Patients are able to view lab/test results, encounter notes, upcoming appointments, etc.  Non-urgent messages can be sent to your provider as well. To learn more about what you can do with MyChart, please visit --  [email or web address].   ? ?

## 2021-04-13 NOTE — Assessment & Plan Note (Addendum)
Routine HCM labs ordered. HCM reviewed/discussed. Anticipatory guidance regarding healthy weight, lifestyle and choices given. ?Recommend healthy diet.  Recommend approximately 150 minutes/week of moderate intensity exercise ?Recommend regular dental and vision exams -patient is up-to-date ?He is up-to-date on tetanus vaccine ?Always use seatbelt/lap and shoulder restraints ?Recommend using smoke alarms and checking batteries at least twice a year ?Recommend using sunscreen when outside ?

## 2021-04-13 NOTE — Progress Notes (Signed)
?  Subjective:   ? ?CC: Annual Physical Exam ? ?HPI:  ?Wesley Watson is a 36 y.o. presenting for annual physical ? ?I reviewed the past medical history, family history, social history, surgical history, and allergies today and no changes were needed.  Please see the problem list section below in epic for further details. ? ?Past Medical History: ?Past Medical History:  ?Diagnosis Date  ? Hypertension   ? ?Past Surgical History: ?No past surgical history on file. ?Social History: ?Social History  ? ?Socioeconomic History  ? Marital status: Single  ?  Spouse name: Not on file  ? Number of children: Not on file  ? Years of education: Not on file  ? Highest education level: Not on file  ?Occupational History  ? Not on file  ?Tobacco Use  ? Smoking status: Never  ? Smokeless tobacco: Not on file  ?Substance and Sexual Activity  ? Alcohol use: No  ? Drug use: No  ? Sexual activity: Not on file  ?Other Topics Concern  ? Not on file  ?Social History Narrative  ? Not on file  ? ?Social Determinants of Health  ? ?Financial Resource Strain: Not on file  ?Food Insecurity: Not on file  ?Transportation Needs: Not on file  ?Physical Activity: Not on file  ?Stress: Not on file  ?Social Connections: Not on file  ? ?Family History: ?Family History  ?Problem Relation Age of Onset  ? Hypertension Mother   ? ?Allergies: ?No Known Allergies ?Medications: See med rec. ? ?Review of Systems: No headache, visual changes, nausea, vomiting, diarrhea, constipation, dizziness, abdominal pain, skin rash, fevers, chills, night sweats, swollen lymph nodes, weight loss, chest pain, body aches, joint swelling, muscle aches, shortness of breath, mood changes, visual or auditory hallucinations. ? ?Objective:   ? ?BP 122/82   Pulse 86   Temp 98.7 ?F (37.1 ?C)   Ht 5\' 10"  (1.778 m)   Wt 287 lb 12.8 oz (130.5 kg)   SpO2 97%   BMI 41.30 kg/m?  ? ?General: Well Developed, well nourished, and in no acute distress.  ?Neuro: Alert and oriented x3,  extra-ocular muscles intact, sensation grossly intact. Cranial nerves II through XII are intact, motor, sensory, and coordinative functions are all intact. ?HEENT: Normocephalic, atraumatic, pupils equal round reactive to light, neck supple, no masses, no lymphadenopathy, thyroid nonpalpable. Oropharynx, nasopharynx, external ear canals are unremarkable. ?Skin: Warm and dry, no rashes noted.  ?Cardiac: Regular rate and rhythm, no murmurs rubs or gallops.  ?Respiratory: Clear to auscultation bilaterally. Not using accessory muscles, speaking in full sentences.  ?Abdominal: Soft, nontender, nondistended, positive bowel sounds, no masses, no organomegaly.  ?Musculoskeletal: Shoulder, elbow, wrist, hip, knee, ankle stable, and with full range of motion. ? ?Impression and Recommendations:   ? ?Wellness examination ?Routine HCM labs ordered. HCM reviewed/discussed. Anticipatory guidance regarding healthy weight, lifestyle and choices given. ?Recommend healthy diet.  Recommend approximately 150 minutes/week of moderate intensity exercise ?Recommend regular dental and vision exams ?Always use seatbelt/lap and shoulder restraints ?Recommend using smoke alarms and checking batteries at least twice a year ?Recommend using sunscreen when outside ? ?Plan for follow-up in about 6 months ? ? ?___________________________________________ ?Avelino Herren de , MD, ABFM, CAQSM ?Primary Care and Sports Medicine ?[facility] ?

## 2021-04-14 ENCOUNTER — Other Ambulatory Visit (HOSPITAL_BASED_OUTPATIENT_CLINIC_OR_DEPARTMENT_OTHER): Payer: Self-pay | Admitting: Family Medicine

## 2021-04-14 DIAGNOSIS — R7401 Elevation of levels of liver transaminase levels: Secondary | ICD-10-CM

## 2021-04-14 LAB — COMPREHENSIVE METABOLIC PANEL
ALT: 49 IU/L — ABNORMAL HIGH (ref 0–44)
AST: 46 IU/L — ABNORMAL HIGH (ref 0–40)
Albumin/Globulin Ratio: 2 (ref 1.2–2.2)
Albumin: 4.7 g/dL (ref 4.0–5.0)
Alkaline Phosphatase: 64 IU/L (ref 44–121)
BUN/Creatinine Ratio: 9 (ref 9–20)
BUN: 11 mg/dL (ref 6–20)
Bilirubin Total: 0.7 mg/dL (ref 0.0–1.2)
CO2: 27 mmol/L (ref 20–29)
Calcium: 9.8 mg/dL (ref 8.7–10.2)
Chloride: 99 mmol/L (ref 96–106)
Creatinine, Ser: 1.16 mg/dL (ref 0.76–1.27)
Globulin, Total: 2.4 g/dL (ref 1.5–4.5)
Glucose: 89 mg/dL (ref 70–99)
Potassium: 4.6 mmol/L (ref 3.5–5.2)
Sodium: 137 mmol/L (ref 134–144)
Total Protein: 7.1 g/dL (ref 6.0–8.5)
eGFR: 85 mL/min/{1.73_m2} (ref >59)

## 2021-04-14 LAB — LIPID PANEL
Chol/HDL Ratio: 2.9 ratio (ref 0.0–5.0)
Cholesterol, Total: 143 mg/dL (ref 100–199)
HDL: 50 mg/dL (ref >39)
LDL Chol Calc (NIH): 82 mg/dL (ref 0–99)
Triglycerides: 53 mg/dL (ref 0–149)
VLDL Cholesterol Cal: 11 mg/dL (ref 5–40)

## 2021-04-14 LAB — CBC WITH DIFFERENTIAL/PLATELET
Basophils Absolute: 0 10*3/uL (ref 0.0–0.2)
Basos: 1 %
EOS (ABSOLUTE): 0.1 10*3/uL (ref 0.0–0.4)
Eos: 1 %
Hematocrit: 46.7 % (ref 37.5–51.0)
Hemoglobin: 15.6 g/dL (ref 13.0–17.7)
Immature Grans (Abs): 0 10*3/uL (ref 0.0–0.1)
Immature Granulocytes: 0 %
Lymphocytes Absolute: 1.6 10*3/uL (ref 0.7–3.1)
Lymphs: 36 %
MCH: 26.9 pg (ref 26.6–33.0)
MCHC: 33.4 g/dL (ref 31.5–35.7)
MCV: 81 fL (ref 79–97)
Monocytes Absolute: 0.4 10*3/uL (ref 0.1–0.9)
Monocytes: 8 %
Neutrophils Absolute: 2.4 10*3/uL (ref 1.4–7.0)
Neutrophils: 54 %
Platelets: 272 10*3/uL (ref 150–450)
RBC: 5.8 x10E6/uL (ref 4.14–5.80)
RDW: 13.1 % (ref 11.6–15.4)
WBC: 4.5 10*3/uL (ref 3.4–10.8)

## 2021-04-14 LAB — HEMOGLOBIN A1C
Est. average glucose Bld gHb Est-mCnc: 114 mg/dL
Hgb A1c MFr Bld: 5.6 % (ref 4.8–5.6)

## 2021-04-14 LAB — TSH RFX ON ABNORMAL TO FREE T4: TSH: 1.33 u[IU]/mL (ref 0.450–4.500)

## 2021-04-20 ENCOUNTER — Other Ambulatory Visit (HOSPITAL_COMMUNITY): Payer: Self-pay

## 2021-04-26 ENCOUNTER — Other Ambulatory Visit (HOSPITAL_COMMUNITY): Payer: Self-pay

## 2021-05-06 LAB — IRON AND TIBC
Iron Saturation: 29 % (ref 15–55)
Iron: 92 ug/dL (ref 38–169)
Total Iron Binding Capacity: 313 ug/dL (ref 250–450)
UIBC: 221 ug/dL (ref 111–343)

## 2021-05-06 LAB — HEPATITIS B SURFACE ANTIGEN: Hepatitis B Surface Ag: NEGATIVE

## 2021-05-06 LAB — SPECIMEN STATUS REPORT

## 2021-05-06 LAB — HEPATITIS B SURFACE ANTIBODY,QUALITATIVE: Hep B Surface Ab, Qual: REACTIVE

## 2021-05-06 LAB — HEPATITIS B CORE ANTIBODY, IGM: Hep B C IgM: NEGATIVE

## 2021-05-06 LAB — HEPATITIS C ANTIBODY: Hep C Virus Ab: NONREACTIVE

## 2021-05-06 LAB — FERRITIN: Ferritin: 294 ng/mL (ref 30–400)

## 2021-05-07 ENCOUNTER — Other Ambulatory Visit (HOSPITAL_COMMUNITY): Payer: Self-pay

## 2021-05-13 ENCOUNTER — Other Ambulatory Visit (HOSPITAL_COMMUNITY): Payer: Self-pay

## 2021-08-12 ENCOUNTER — Other Ambulatory Visit (HOSPITAL_COMMUNITY): Payer: Self-pay

## 2021-09-09 IMAGING — CT CT RENAL STONE PROTOCOL
2 of 4 series · 16 of 46 positions shown, 18 images · non-contrast
Comparison: None.

CLINICAL DATA: Right lower abdominal pain radiating into the back
for approximately 2 hours.

EXAM:
CT ABDOMEN AND PELVIS WITHOUT CONTRAST
TECHNIQUE: Multidetector CT imaging of the abdomen and pelvis was performed
following the standard protocol without IV contrast.

[Series 2: axial st · axial · 0.75mm/px · z∈[+1127,+1582]mm · 13 of 103 slices shown, 15 images]
[im 6/103  soft-tissue]
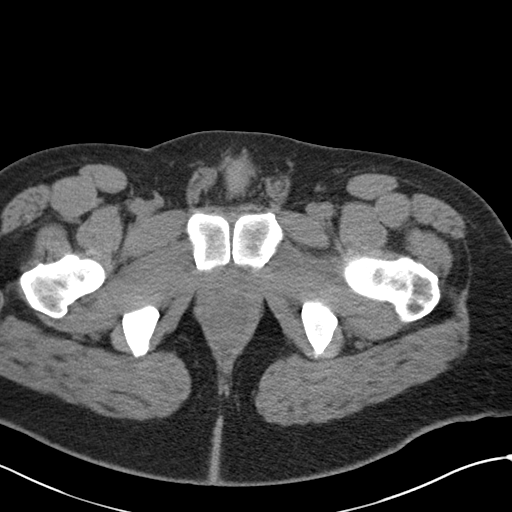
[im 6/103  bone]
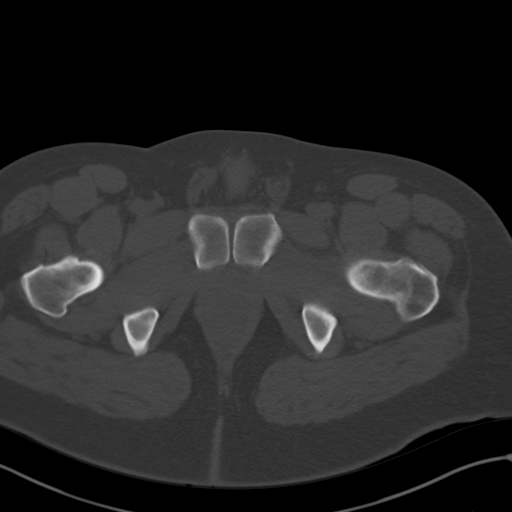
[im 12/103  soft-tissue]
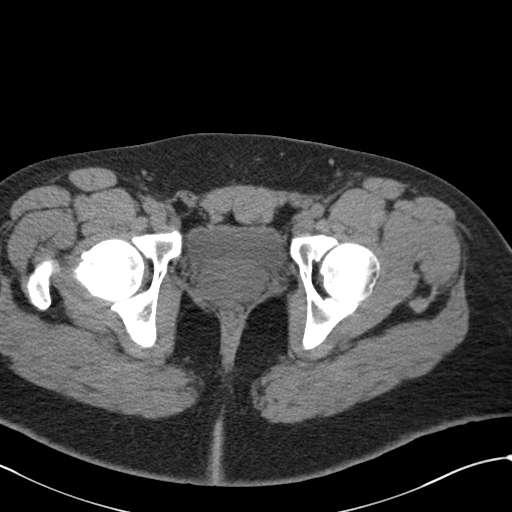
[im 23/103  soft-tissue]
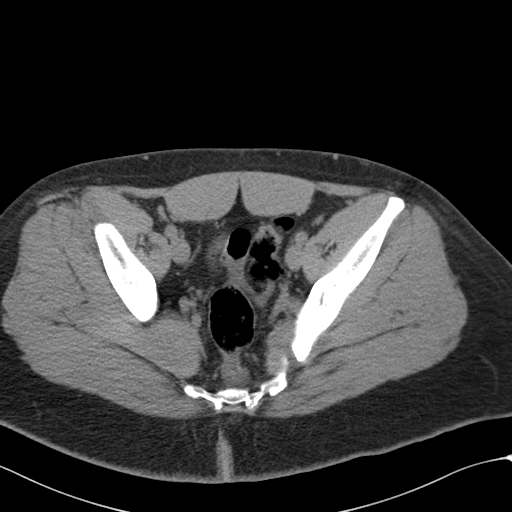
[im 29/103  soft-tissue]
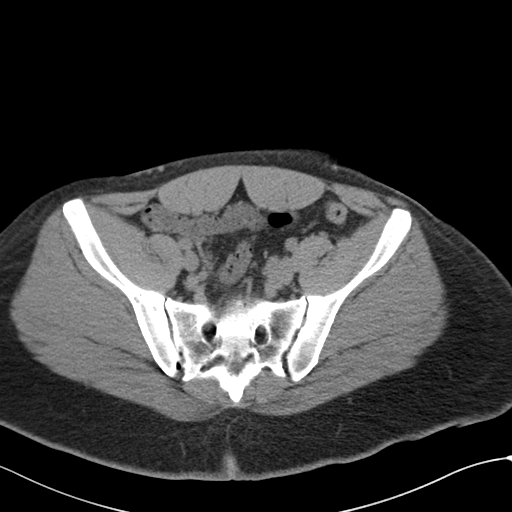
[im 35/103  soft-tissue]
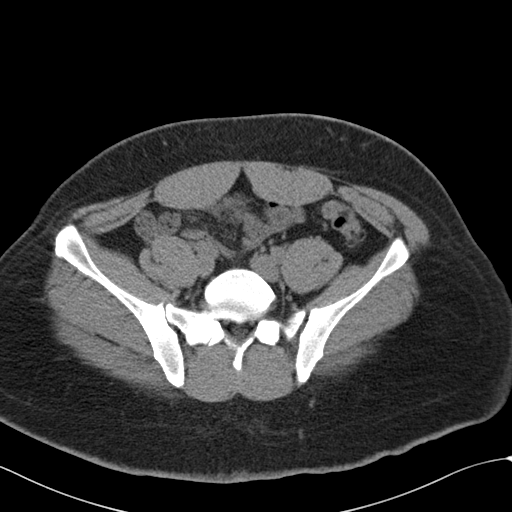
[im 46/103  soft-tissue]
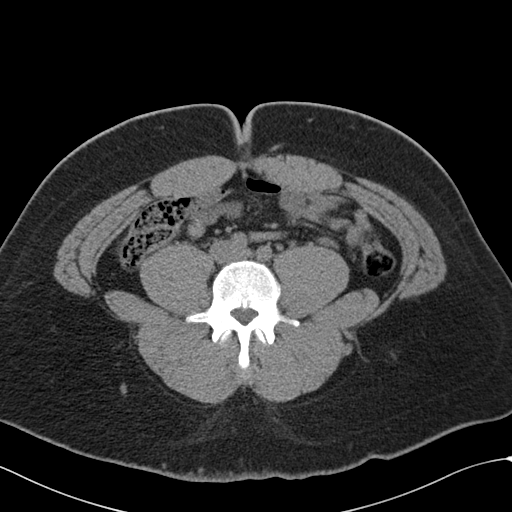
[im 52/103  soft-tissue]
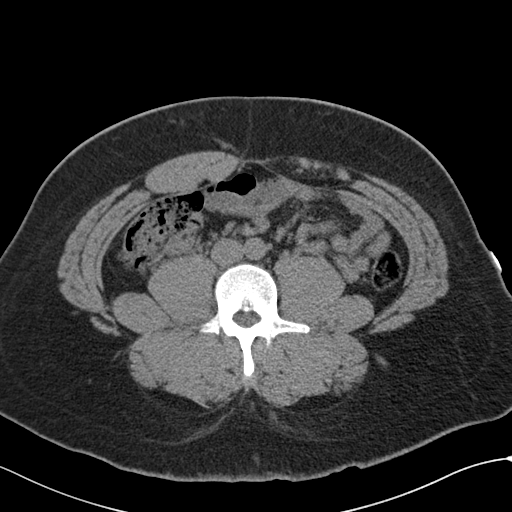
[im 57/103  soft-tissue]
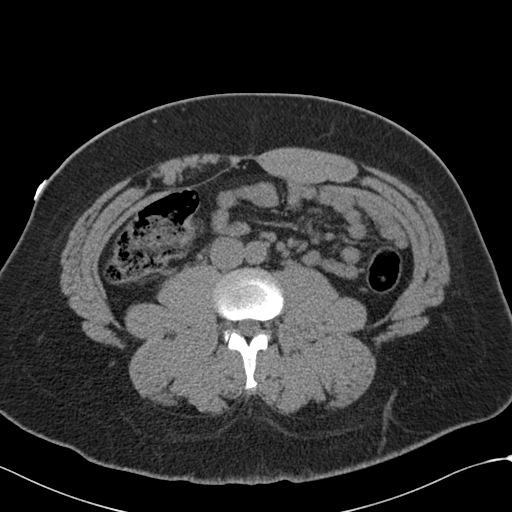
[im 69/103  soft-tissue]
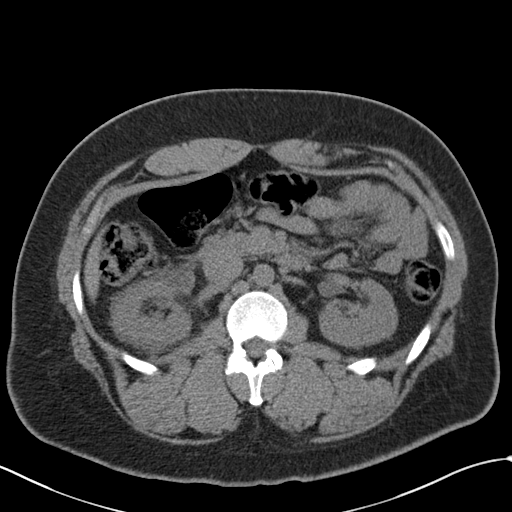
[im 69/103  bone]
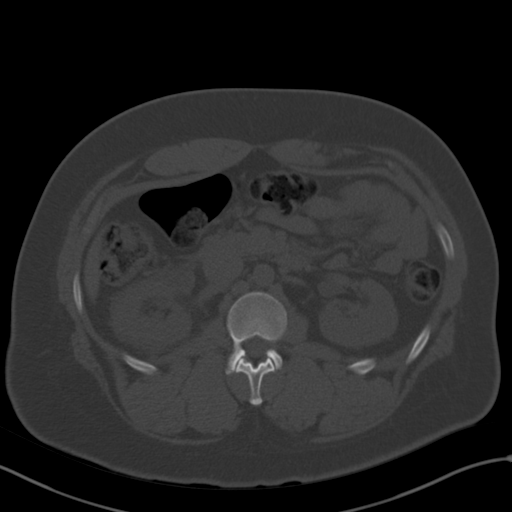
[im 74/103  soft-tissue]
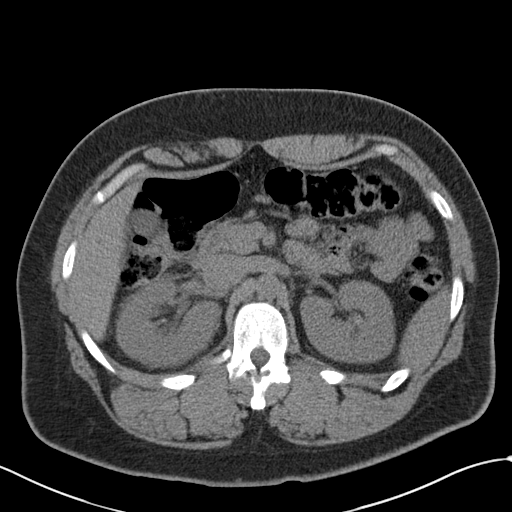
[im 80/103  soft-tissue]
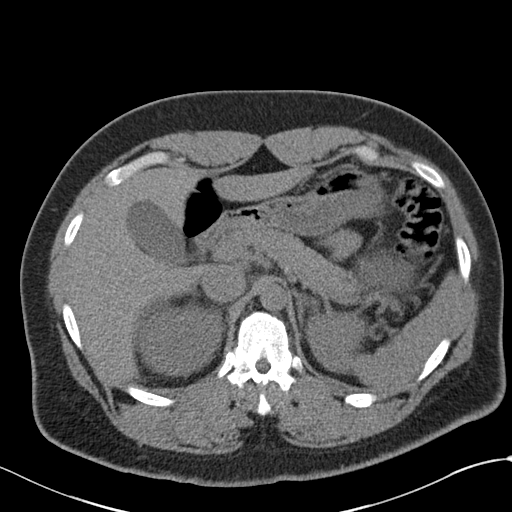
[im 91/103  soft-tissue]
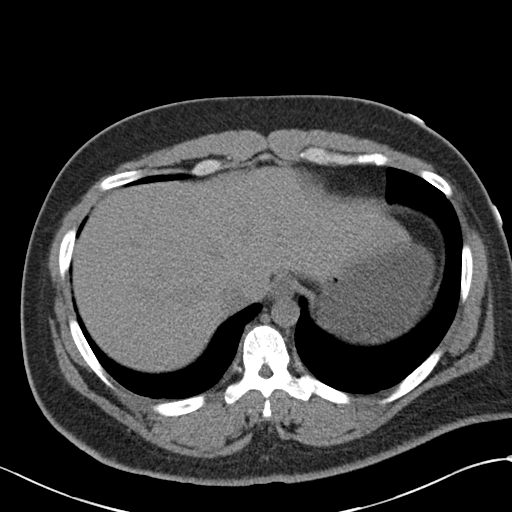
[im 97/103  soft-tissue]
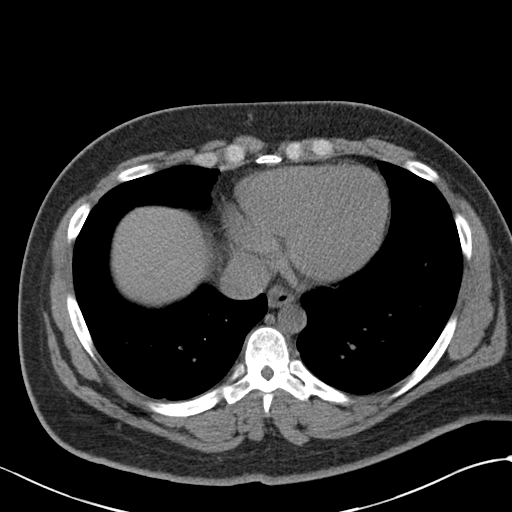

[Series 5: coronal · coronal · 0.82mm/px · 3 of 150 slices shown]
[im 50/150  soft-tissue]
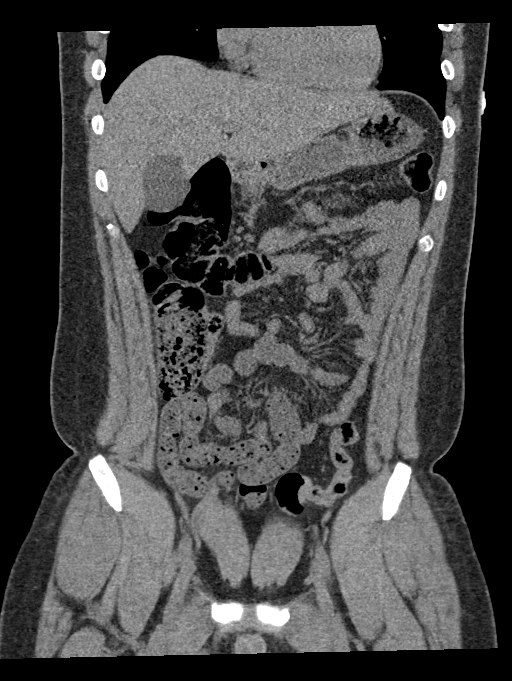
[im 67/150  soft-tissue]
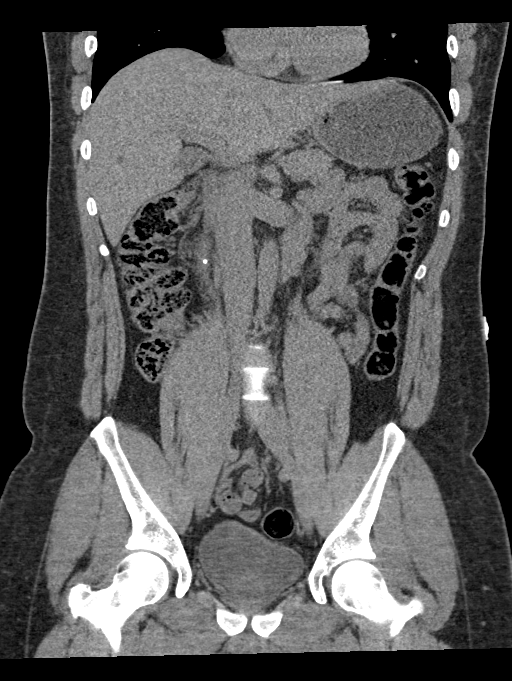
[im 83/150  soft-tissue]
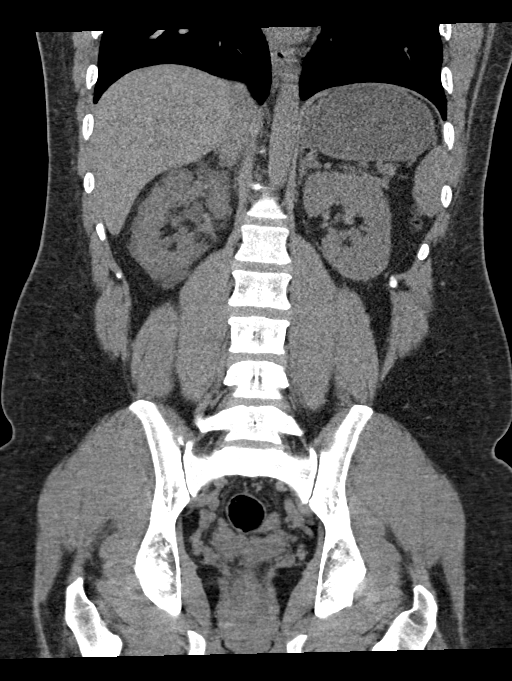

[16 of 46 positions shown; findings below may reference images not displayed]

FINDINGS: Lower chest: Lung bases clear.  No pleural or pericardial effusion.

Hepatobiliary: No focal liver abnormality is seen. No gallstones,
gallbladder wall thickening, or biliary dilatation.

Pancreas: Unremarkable. No pancreatic ductal dilatation or
surrounding inflammatory changes.

Spleen: Normal in size without focal abnormality.

Adrenals/Urinary Tract: The patient has mild right hydronephrosis
with stranding about the right kidney and ureter due to a 0.3 cm
stone just inferior to the ureteropelvic junction. Single punctate
nonobstructing stones are also seen in each kidney. No left
hydronephrosis or ureteral stone. Urinary bladder is unremarkable.
The adrenal glands appear normal.

Stomach/Bowel: Stomach is within normal limits. Appendix appears
normal. No evidence of bowel wall thickening, distention, or
inflammatory changes.

Vascular/Lymphatic: No significant vascular findings are present. No
enlarged abdominal or pelvic lymph nodes.

Reproductive: Prostate is unremarkable.

Other: Small fat containing umbilical hernia is noted.

Musculoskeletal: No acute or focal bony abnormality.
IMPRESSION: Mild right hydronephrosis due to a 0.3 cm stone just inferior to the
ureteropelvic junction. Single punctate nonobstructing stones are
also seen in each kidney.

Small fat containing umbilical hernia.

## 2021-10-13 ENCOUNTER — Ambulatory Visit (HOSPITAL_BASED_OUTPATIENT_CLINIC_OR_DEPARTMENT_OTHER): Payer: No Typology Code available for payment source | Admitting: Family Medicine

## 2021-10-25 ENCOUNTER — Ambulatory Visit (INDEPENDENT_AMBULATORY_CARE_PROVIDER_SITE_OTHER): Payer: No Typology Code available for payment source | Admitting: Family Medicine

## 2021-10-25 ENCOUNTER — Encounter (HOSPITAL_BASED_OUTPATIENT_CLINIC_OR_DEPARTMENT_OTHER): Payer: Self-pay | Admitting: Family Medicine

## 2021-10-25 VITALS — BP 166/109 | HR 77 | Ht 70.0 in | Wt 282.0 lb

## 2021-10-25 DIAGNOSIS — I10 Essential (primary) hypertension: Secondary | ICD-10-CM

## 2021-10-25 NOTE — Assessment & Plan Note (Signed)
Blood pressure is elevated in office today.  Patient reports that he did not take his blood pressure medication this morning.  He does still have medication at home and is not needing refill today He does report that he will occasionally check his blood pressure at home and indicates that his systolic readings have been between 120 and 140.  He denies any issues with chest pain or headaches. Recheck blood pressure today for monitoring Recommend intermittent monitoring of blood pressure at home, DASH diet We will plan for close follow-up in about 4 to 6 weeks to recheck blood pressure and ensure appropriate control with current medication regimen.  Advised patient to take medication morning of appointment so that we can have accurate assessment of current status

## 2021-10-25 NOTE — Progress Notes (Signed)
    Procedures performed today:    None.  Independent interpretation of notes and tests performed by another provider:   None.  Brief History, Exam, Impression, and Recommendations:    BP (!) 166/109   Pulse 77   Ht 5\' 10"  (1.778 m)   Wt 282 lb (127.9 kg)   SpO2 100%   BMI 40.46 kg/m   Hypertension Blood pressure is elevated in office today.  Patient reports that he did not take his blood pressure medication this morning.  He does still have medication at home and is not needing refill today He does report that he will occasionally check his blood pressure at home and indicates that his systolic readings have been between 120 and 140.  He denies any issues with chest pain or headaches. Recheck blood pressure today for monitoring Recommend intermittent monitoring of blood pressure at home, DASH diet We will plan for close follow-up in about 4 to 6 weeks to recheck blood pressure and ensure appropriate control with current medication regimen.  Advised patient to take medication morning of appointment so that we can have accurate assessment of current status  Return in about 6 weeks (around 12/06/2021) for HTN.   ___________________________________________ Christino Mcglinchey de [place], MD, ABFM, [facility] and [place]

## 2021-10-25 NOTE — Patient Instructions (Signed)

## 2021-10-26 ENCOUNTER — Encounter (HOSPITAL_BASED_OUTPATIENT_CLINIC_OR_DEPARTMENT_OTHER): Payer: Self-pay | Admitting: Family Medicine

## 2021-11-09 ENCOUNTER — Ambulatory Visit
Admission: RE | Admit: 2021-11-09 | Discharge: 2021-11-09 | Disposition: A | Discharge status: Home / Self Care | Payer: No Typology Code available for payment source | Source: Ambulatory Visit | Attending: Family Medicine | Admitting: Family Medicine

## 2021-11-09 DIAGNOSIS — R7401 Elevation of levels of liver transaminase levels: Secondary | ICD-10-CM

## 2021-11-18 ENCOUNTER — Other Ambulatory Visit (HOSPITAL_COMMUNITY): Payer: Self-pay

## 2021-11-18 ENCOUNTER — Other Ambulatory Visit (HOSPITAL_BASED_OUTPATIENT_CLINIC_OR_DEPARTMENT_OTHER): Payer: Self-pay | Admitting: Family Medicine

## 2021-11-18 MED ORDER — AMLODIPINE BESYLATE-VALSARTAN 5-320 MG PO TABS
1.0000 | ORAL_TABLET | Freq: Every day | ORAL | 1 refills | Status: DC
  Filled 2021-11-18: qty 90, 90d supply, fill #0

## 2021-12-01 ENCOUNTER — Ambulatory Visit (HOSPITAL_BASED_OUTPATIENT_CLINIC_OR_DEPARTMENT_OTHER): Payer: No Typology Code available for payment source | Admitting: Family Medicine

## 2021-12-07 ENCOUNTER — Encounter (HOSPITAL_BASED_OUTPATIENT_CLINIC_OR_DEPARTMENT_OTHER): Payer: Self-pay | Admitting: Family Medicine

## 2021-12-07 ENCOUNTER — Other Ambulatory Visit (HOSPITAL_COMMUNITY): Payer: Self-pay

## 2021-12-07 ENCOUNTER — Ambulatory Visit (INDEPENDENT_AMBULATORY_CARE_PROVIDER_SITE_OTHER): Payer: No Typology Code available for payment source | Admitting: Family Medicine

## 2021-12-07 VITALS — BP 148/104 | HR 79 | Temp 97.9°F | Ht 70.0 in | Wt 281.9 lb

## 2021-12-07 DIAGNOSIS — I10 Essential (primary) hypertension: Secondary | ICD-10-CM

## 2021-12-07 MED ORDER — HYDROCHLOROTHIAZIDE 12.5 MG PO TABS
12.5000 mg | ORAL_TABLET | Freq: Every day | ORAL | 1 refills | Status: DC
  Filled 2021-12-07: qty 90, 90d supply, fill #0

## 2021-12-07 NOTE — Progress Notes (Signed)
    Procedures performed today:    None.  Independent interpretation of notes and tests performed by another provider:   None.  Brief History, Exam, Impression, and Recommendations:    BP (!) 148/104 (BP Location: Right Arm, Patient Position: Sitting, Cuff Size: Large)   Pulse 79   Temp 97.9 F (36.6 C) (Oral)   Ht 5\' 10"  (1.778 m)   Wt 281 lb 14.4 oz (127.9 kg)   SpO2 100%   BMI 40.45 kg/m   Hypertension Blood pressure is elevated in office today, slightly improved since last office visit.  He does check his blood pressure occasionally at home and generally systolic readings are in the 130s to 140s, diastolic pain 80s.  He has not had any issues with chest pain or headaches. We discussed options in office today, we will proceed with continuation of valsartan-amlodipine and we will add low-dose of hydrochlorothiazide to help with better control of blood pressure.  Recommend that he continue checking blood pressure intermittently at home, provided handout reviewing-diet.  Recommend that he bring home blood pressure cuff into the office at next visit so that we may compare home cuff to our readings here in the office Advised to have patient return in about 2 weeks to check BMP after starting hydrochlorothiazide  Return in about 4 weeks (around 01/04/2022) for HTN.   ___________________________________________ Evalina Tabak de 01/06/2022, MD, ABFM, [facility] and Sports Medicine [facility]

## 2021-12-07 NOTE — Patient Instructions (Signed)
  Medication Instructions:  Your physician recommends that you continue on your current medications as directed. Please refer to the Current Medication list given to you today. --If you need a refill on any your medications before your next appointment, please call your pharmacy first. If no refills are authorized on file call the office.-- Lab Work: Your physician has recommended that you have lab work today: Yes If you have labs (blood work) drawn today and your tests are completely normal, you will receive your results via MyChart message OR a phone call from our staff.  Please ensure you check your voicemail in the event that you authorized detailed messages to be left on a delegated number. If you have any lab test that is abnormal or we need to change your treatment, we will call you to review the results.  Referrals/Procedures/Imaging: No  Follow-Up: Your next appointment:   Your physician recommends that you schedule a follow-up appointment in: 3-4 weeks with Dr. de [place].  You will receive a text message or e-mail with a link to a survey about your care and experience with us today! We would greatly appreciate your feedback!   Thanks for letting us be apart of your health journey!!  Primary Care and Sports Medicine   Dr. Raymond de [place]   We encourage you to activate your patient portal called "MyChart".  Sign up information is provided on this After Visit Summary.  MyChart is used to connect with patients for Virtual Visits (Telemedicine).  Patients are able to view lab/test results, encounter notes, upcoming appointments, etc.  Non-urgent messages can be sent to your provider as well. To learn more about what you can do with MyChart, please visit --  [email or web address].    

## 2021-12-07 NOTE — Assessment & Plan Note (Signed)
Blood pressure is elevated in office today, slightly improved since last office visit.  He does check his blood pressure occasionally at home and generally systolic readings are in the 130s to 140s, diastolic pain 80s.  He has not had any issues with chest pain or headaches. We discussed options in office today, we will proceed with continuation of valsartan-amlodipine and we will add low-dose of hydrochlorothiazide to help with better control of blood pressure.  Recommend that he continue checking blood pressure intermittently at home, provided handout reviewing-diet.  Recommend that he bring home blood pressure cuff into the office at next visit so that we may compare home cuff to our readings here in the office Advised to have patient return in about 2 weeks to check BMP after starting hydrochlorothiazide

## 2021-12-21 ENCOUNTER — Ambulatory Visit (HOSPITAL_BASED_OUTPATIENT_CLINIC_OR_DEPARTMENT_OTHER): Payer: No Typology Code available for payment source

## 2021-12-21 ENCOUNTER — Encounter (HOSPITAL_BASED_OUTPATIENT_CLINIC_OR_DEPARTMENT_OTHER): Payer: Self-pay

## 2022-01-05 ENCOUNTER — Ambulatory Visit (HOSPITAL_BASED_OUTPATIENT_CLINIC_OR_DEPARTMENT_OTHER): Payer: No Typology Code available for payment source | Admitting: Family Medicine

## 2022-01-18 ENCOUNTER — Ambulatory Visit (INDEPENDENT_AMBULATORY_CARE_PROVIDER_SITE_OTHER): Payer: 59 | Admitting: Family Medicine

## 2022-01-18 ENCOUNTER — Other Ambulatory Visit (HOSPITAL_BASED_OUTPATIENT_CLINIC_OR_DEPARTMENT_OTHER): Payer: Self-pay | Admitting: Family Medicine

## 2022-01-18 ENCOUNTER — Other Ambulatory Visit (HOSPITAL_COMMUNITY): Payer: Self-pay

## 2022-01-18 ENCOUNTER — Encounter (HOSPITAL_BASED_OUTPATIENT_CLINIC_OR_DEPARTMENT_OTHER): Payer: Self-pay | Admitting: Family Medicine

## 2022-01-18 VITALS — BP 133/89 | HR 79 | Temp 97.6°F | Ht 70.0 in | Wt 283.4 lb

## 2022-01-18 DIAGNOSIS — I10 Essential (primary) hypertension: Secondary | ICD-10-CM

## 2022-01-18 DIAGNOSIS — Z0000 Encounter for general adult medical examination without abnormal findings: Secondary | ICD-10-CM

## 2022-01-18 MED ORDER — HYDROCHLOROTHIAZIDE 12.5 MG PO TABS
12.5000 mg | ORAL_TABLET | Freq: Every day | ORAL | 1 refills | Status: DC
  Filled 2022-01-18 – 2022-03-23 (×3): qty 90, 90d supply, fill #0

## 2022-01-18 MED ORDER — AMLODIPINE BESYLATE-VALSARTAN 5-320 MG PO TABS
1.0000 | ORAL_TABLET | Freq: Every day | ORAL | 1 refills | Status: DC
  Filled 2022-01-18 – 2022-02-15 (×2): qty 90, 90d supply, fill #0

## 2022-01-18 NOTE — Assessment & Plan Note (Signed)
Blood pressure is at goal in office today.  He reports that he has been checking his blood pressure at home, utilizing a wrist cuff, however he feels that readings have been high with his blood pressure cuff.  He plans to check with pharmacy or location where he received the device for troubleshooting.  He has not had any issues with chest pain, headaches.  Generally he has been feeling well with addition of hydrochlorothiazide at last office visit.  He has noted some increased frequency of urination, however generally has been doing well with the medication. Given good control of blood pressure in the office today, we can continue with current medication regimen.  He was due to come back and recheck BMP to monitor kidney function and electrolytes, however he has not done so as of yet, we will check this today Will plan for follow-up in about 3 to 4 months to complete physical and can recheck blood pressure at that time as well Recommend intermittent monitoring of blood pressure at home, DASH diet

## 2022-01-18 NOTE — Patient Instructions (Signed)
  Medication Instructions:  Your physician recommends that you continue on your current medications as directed. Please refer to the Current Medication list given to you today. --If you need a refill on any your medications before your next appointment, please call your pharmacy first. If no refills are authorized on file call the office.-- Lab Work: Your physician has recommended that you have lab work today: Yes If you have labs (blood work) drawn today and your tests are completely normal, you will receive your results via MyChart message OR a phone call from our staff.  Please ensure you check your voicemail in the event that you authorized detailed messages to be left on a delegated number. If you have any lab test that is abnormal or we need to change your treatment, we will call you to review the results.  Referrals/Procedures/Imaging: No  Follow-Up: Your next appointment:   Your physician recommends that you schedule a follow-up appointment in: 3-4 months with Dr. de [place].  You will receive a text message or e-mail with a link to a survey about your care and experience with us today! We would greatly appreciate your feedback!   Thanks for letting us be apart of your health journey!!  Primary Care and Sports Medicine   Dr. Raymond de [place]   We encourage you to activate your patient portal called "MyChart".  Sign up information is provided on this After Visit Summary.  MyChart is used to connect with patients for Virtual Visits (Telemedicine).  Patients are able to view lab/test results, encounter notes, upcoming appointments, etc.  Non-urgent messages can be sent to your provider as well. To learn more about what you can do with MyChart, please visit --  [email or web address].    

## 2022-01-18 NOTE — Progress Notes (Signed)
    Procedures performed today:    None.  Independent interpretation of notes and tests performed by another provider:   None.  Brief History, Exam, Impression, and Recommendations:    BP 133/89 (BP Location: Left Arm, Patient Position: Sitting, Cuff Size: Large)   Pulse 79   Temp 97.6 F (36.4 C) (Oral)   Ht 5\' 10"  (1.778 m)   Wt 283 lb 6.4 oz (128.5 kg)   SpO2 100%   BMI 40.66 kg/m   Hypertension Blood pressure is at goal in office today.  He reports that he has been checking his blood pressure at home, utilizing a wrist cuff, however he feels that readings have been high with his blood pressure cuff.  He plans to check with pharmacy or location where he received the device for troubleshooting.  He has not had any issues with chest pain, headaches.  Generally he has been feeling well with addition of hydrochlorothiazide at last office visit.  He has noted some increased frequency of urination, however generally has been doing well with the medication. Given good control of blood pressure in the office today, we can continue with current medication regimen.  He was due to come back and recheck BMP to monitor kidney function and electrolytes, however he has not done so as of yet, we will check this today Will plan for follow-up in about 3 to 4 months to complete physical and can recheck blood pressure at that time as well Recommend intermittent monitoring of blood pressure at home, DASH diet  Return in about 3 months (around 04/19/2022) for CPE with [facility] a few days prior.   ___________________________________________ Lorayne Getchell de [place], MD, ABFM, [facility] and [place]

## 2022-01-19 LAB — BASIC METABOLIC PANEL
BUN/Creatinine Ratio: 12 (ref 9–20)
BUN: 14 mg/dL (ref 6–20)
CO2: 28 mmol/L (ref 20–29)
Calcium: 10.1 mg/dL (ref 8.7–10.2)
Chloride: 98 mmol/L (ref 96–106)
Creatinine, Ser: 1.15 mg/dL (ref 0.76–1.27)
Glucose: 73 mg/dL (ref 70–99)
Potassium: 4.4 mmol/L (ref 3.5–5.2)
Sodium: 139 mmol/L (ref 134–144)
eGFR: 85 mL/min/{1.73_m2} (ref >59)

## 2022-02-15 ENCOUNTER — Other Ambulatory Visit (HOSPITAL_COMMUNITY): Payer: Self-pay

## 2022-02-21 ENCOUNTER — Telehealth (HOSPITAL_BASED_OUTPATIENT_CLINIC_OR_DEPARTMENT_OTHER): Payer: Self-pay | Admitting: Family Medicine

## 2022-02-21 NOTE — Telephone Encounter (Signed)
Please reconcile the Flu Vaccine

## 2022-03-01 NOTE — Telephone Encounter (Signed)
This one is not showing updated

## 2022-03-23 ENCOUNTER — Other Ambulatory Visit (HOSPITAL_COMMUNITY): Payer: Self-pay

## 2022-04-13 ENCOUNTER — Ambulatory Visit (HOSPITAL_BASED_OUTPATIENT_CLINIC_OR_DEPARTMENT_OTHER): Payer: 59

## 2022-04-20 ENCOUNTER — Encounter (HOSPITAL_BASED_OUTPATIENT_CLINIC_OR_DEPARTMENT_OTHER): Payer: 59 | Admitting: Family Medicine

## 2022-05-04 ENCOUNTER — Encounter (HOSPITAL_BASED_OUTPATIENT_CLINIC_OR_DEPARTMENT_OTHER): Payer: Self-pay | Admitting: Family Medicine

## 2022-05-04 ENCOUNTER — Ambulatory Visit (INDEPENDENT_AMBULATORY_CARE_PROVIDER_SITE_OTHER): Payer: 59 | Admitting: Family Medicine

## 2022-05-04 ENCOUNTER — Other Ambulatory Visit (HOSPITAL_COMMUNITY): Payer: Self-pay

## 2022-05-04 VITALS — BP 145/97 | HR 81 | Ht 70.0 in | Wt 291.0 lb

## 2022-05-04 DIAGNOSIS — L853 Xerosis cutis: Secondary | ICD-10-CM | POA: Diagnosis not present

## 2022-05-04 DIAGNOSIS — I10 Essential (primary) hypertension: Secondary | ICD-10-CM

## 2022-05-04 DIAGNOSIS — Z0000 Encounter for general adult medical examination without abnormal findings: Secondary | ICD-10-CM | POA: Diagnosis not present

## 2022-05-04 MED ORDER — AMLODIPINE BESYLATE-VALSARTAN 5-320 MG PO TABS
1.0000 | ORAL_TABLET | Freq: Every day | ORAL | 1 refills | Status: DC
  Filled 2022-05-04 (×2): qty 90, 90d supply, fill #0

## 2022-05-04 MED ORDER — HYDROCHLOROTHIAZIDE 25 MG PO TABS
25.0000 mg | ORAL_TABLET | Freq: Every day | ORAL | 1 refills | Status: DC
  Filled 2022-05-04: qty 90, 90d supply, fill #0

## 2022-05-04 NOTE — Progress Notes (Signed)
Complete physical exam  Patient: Wesley Watson   DOB: [birth year]   36 y.o. Male  MRN: [id number]  Subjective:    Wesley Watson is a 37 y.o. male who presents today for a complete physical exam. He reports consuming a general diet. Haven't been eating properly this month. Gym/ health club routine includes cardio and mod to heavy weightlifting. He generally feels well. He reports sleeping well. He does not have additional problems to discuss today.   He is just getting over allergies for pollen. Congestion currently but improving.   Home BP readings: reports highest readings 185/85 reading last week  Usually 140s/90s    Depression screenings:    05/04/2022    8:31 AM 01/18/2022    2:39 PM 12/07/2021    2:07 PM  Depression screen PHQ 2/9  Decreased Interest 0 0 0  Down, Depressed, Hopeless 0 0 0  PHQ - 2 Score 0 0 0  Altered sleeping 1 0 0  Tired, decreased energy 0 0 0  Change in appetite 1 0 0  Feeling bad or failure about yourself  0 0 0  Trouble concentrating 0 0 0  Moving slowly or fidgety/restless 0 0 0  Suicidal thoughts 0 0 0  PHQ-9 Score 2 0 0  Difficult doing work/chores Not difficult at all Not difficult at all Not difficult at all    Anxiety screenings:    05/04/2022    8:32 AM 01/18/2022    2:39 PM 12/07/2021    2:07 PM 10/25/2021    8:46 AM  GAD 7 : Generalized Anxiety Score  Nervous, Anxious, on Edge 0 0 0 0  Control/stop worrying 0 0 0 0  Worry too much - different things 0 0 0 0  Trouble relaxing 0 0 0 0  Restless 0 0 0 0  Easily annoyed or irritable 0 0 0 0  Afraid - awful might happen 0 0 0 0  Total GAD 7 Score 0 0 0 0  Anxiety Difficulty Not difficult at all Not difficult at all Not difficult at all Not difficult at all   Vision:  Dentist:  Patient Care Team: [place], Buren Kos, MD as PCP - General (Family Medicine)   Outpatient Medications Prior to Visit  Medication Sig   [DISCONTINUED] amLODipine-valsartan (EXFORGE) 5-320 MG tablet Take 1  tablet by mouth daily.   [DISCONTINUED] hydrochlorothiazide (HYDRODIURIL) 12.5 MG tablet Take 1 tablet (12.5 mg total) by mouth daily.   No facility-administered medications prior to visit.    Review of Systems  Constitutional:  Negative for chills, fever, malaise/fatigue and weight loss.  HENT:  Positive for congestion.   Eyes:  Negative for blurred vision and double vision.  Respiratory:  Negative for cough and shortness of breath.   Cardiovascular:  Negative for chest pain and palpitations.  Gastrointestinal:  Negative for abdominal pain, nausea and vomiting.  Genitourinary:  Negative for frequency and urgency.  Musculoskeletal:  Negative for myalgias.  Neurological:  Negative for dizziness, weakness and headaches.  Psychiatric/Behavioral:  Negative for depression and suicidal ideas. The patient is not nervous/anxious.       Objective:   BP (!) 145/97   Pulse 81   Ht  (1.778 m)   Wt 291 lb (132 kg)   SpO2 100%   BMI 41.75 kg/m  BP Readings from Last 3 Encounters:  05/04/22 (!) 145/97  01/18/22 133/89  12/07/21 (!) 148/104    Physical Exam Constitutional:  Appearance: Normal appearance. He is obese.  HENT:     Right Ear: Tympanic membrane, ear canal and external ear normal.     Left Ear: Tympanic membrane, ear canal and external ear normal.     Nose: Nose normal.     Mouth/Throat:     Mouth: Mucous membranes are moist.     Pharynx: Oropharynx is clear.  Eyes:     Extraocular Movements: Extraocular movements intact.     Pupils: Pupils are equal, round, and reactive to light.  Cardiovascular:     Rate and Rhythm: Normal rate and regular rhythm.     Pulses: Normal pulses.     Heart sounds: Normal heart sounds.  Pulmonary:     Effort: Pulmonary effort is normal.     Breath sounds: Normal breath sounds.  Abdominal:     General: Bowel sounds are normal.     Palpations: Abdomen is soft.  Musculoskeletal:        General: Normal range of motion.      Cervical back: Normal range of motion and neck supple.  Skin:    General: Skin is warm and dry.     Coloration: Skin is ashen (small circular patch on R side of scalp).  Neurological:     Mental Status: He is alert.  Psychiatric:        Mood and Affect: Mood normal.        Behavior: Behavior normal.        Thought Content: Thought content normal.        Judgment: Judgment normal.     Assessment & Plan:    Routine Health Maintenance and Physical Exam  Health Maintenance  Topic Date Due   COVID-19 Vaccine (1) Never done   HIV Screening  Never done   Flu Shot  08/11/2022   DTaP/Tdap/Td vaccine (2 - Td or Tdap) 05/31/2026   Hepatitis C Screening: USPSTF Recommendation to screen - Ages 87-79 yo.  Completed   HPV Vaccine  Aged Out   1. Wellness examination Routine HCM labs ordered. Will obtain labs today and update patient with results. He is currently fasting. Review of PMH, FH, SH, medications and HM performed.  Recommend healthy diet.  Recommend approximately 150 minutes/week of moderate intensity exercise. Recommend regular dental and vision exams. Always use seatbelt/lap and shoulder restraints. Recommend using smoke alarms and checking batteries at least twice a year. Recommend using sunscreen when outside. Discussed immunization recommendations. Vaccines are UTD. - CBC with Differential/Platelet - Comprehensive metabolic panel - Hemoglobin A1c - HIV Antibody (routine testing w rflx) - Lipid panel - TSH Rfx on Abnormal to Free T4  2. Primary hypertension Patient is currently taking amlodipine-valsartan 5-320mg  daily and HCTZ 12.5mg  daily. Blood pressure borderline in office today with recheck. Cardiovascular exam with heart regular rate and rhythm. Normal heart sounds, no murmurs present. No lower extremity edema present. Lungs clear to auscultation bilaterally.  Denies chest pain, palpitations, changes in vision, shortness of breath, lower extremity edema, headaches,  lightheadedness, weakness, cough.  Discussed options, plan to increase HCTZ today to 25mg  daily. Advised him to continue checking his blood pressure at home and recording readings for follow-up visit.  - hydrochlorothiazide (HYDRODIURIL) 25 MG tablet; Take 1 tablet (25 mg total) by mouth daily.  Dispense: 90 tablet; Refill: 1  3. Dry skin on scalp He does have a small circular macular patch on the right side of his scalp that he has noticed has been there for "a little while."  He reports that it appears to be dry skin and he has been trying to keep it moisturized. Denies itching, redness, crusting, raised borders. Advised him to continue moisturizing area, advised him to use Aquaphor. Educated about skin cancer screening. Advised him if he does not notice improvement with Aquaphor, he can request dermatology referral.    Plan to return in 3 months for HTN follow-up.    Alyson Reedy, FNP

## 2022-05-05 LAB — COMPREHENSIVE METABOLIC PANEL
ALT: 22 IU/L (ref 0–44)
AST: 25 IU/L (ref 0–40)
Albumin/Globulin Ratio: 1.7 (ref 1.2–2.2)
Albumin: 4.5 g/dL (ref 4.1–5.1)
Alkaline Phosphatase: 76 IU/L (ref 44–121)
BUN/Creatinine Ratio: 11 (ref 9–20)
BUN: 13 mg/dL (ref 6–20)
Bilirubin Total: 0.5 mg/dL (ref 0.0–1.2)
CO2: 25 mmol/L (ref 20–29)
Calcium: 9.7 mg/dL (ref 8.7–10.2)
Chloride: 101 mmol/L (ref 96–106)
Creatinine, Ser: 1.17 mg/dL (ref 0.76–1.27)
Globulin, Total: 2.7 g/dL (ref 1.5–4.5)
Glucose: 95 mg/dL (ref 70–99)
Potassium: 5 mmol/L (ref 3.5–5.2)
Sodium: 141 mmol/L (ref 134–144)
Total Protein: 7.2 g/dL (ref 6.0–8.5)
eGFR: 83 mL/min/{1.73_m2} (ref >59)

## 2022-05-05 LAB — CBC WITH DIFFERENTIAL/PLATELET
Basophils Absolute: 0 10*3/uL (ref 0.0–0.2)
Basos: 1 %
EOS (ABSOLUTE): 0.1 10*3/uL (ref 0.0–0.4)
Eos: 2 %
Hematocrit: 49.9 % (ref 37.5–51.0)
Hemoglobin: 15.8 g/dL (ref 13.0–17.7)
Immature Grans (Abs): 0 10*3/uL (ref 0.0–0.1)
Immature Granulocytes: 0 %
Lymphocytes Absolute: 1.9 10*3/uL (ref 0.7–3.1)
Lymphs: 38 %
MCH: 25.9 pg — ABNORMAL LOW (ref 26.6–33.0)
MCHC: 31.7 g/dL (ref 31.5–35.7)
MCV: 82 fL (ref 79–97)
Monocytes Absolute: 0.3 10*3/uL (ref 0.1–0.9)
Monocytes: 6 %
Neutrophils Absolute: 2.7 10*3/uL (ref 1.4–7.0)
Neutrophils: 53 %
Platelets: 283 10*3/uL (ref 150–450)
RBC: 6.1 x10E6/uL — ABNORMAL HIGH (ref 4.14–5.80)
RDW: 13 % (ref 11.6–15.4)
WBC: 5.1 10*3/uL (ref 3.4–10.8)

## 2022-05-05 LAB — HEMOGLOBIN A1C
Est. average glucose Bld gHb Est-mCnc: 117 mg/dL
Hgb A1c MFr Bld: 5.7 % — ABNORMAL HIGH (ref 4.8–5.6)

## 2022-05-05 LAB — LIPID PANEL
Chol/HDL Ratio: 3.3 ratio (ref 0.0–5.0)
Cholesterol, Total: 147 mg/dL (ref 100–199)
HDL: 45 mg/dL (ref >39)
LDL Chol Calc (NIH): 85 mg/dL (ref 0–99)
Triglycerides: 93 mg/dL (ref 0–149)
VLDL Cholesterol Cal: 17 mg/dL (ref 5–40)

## 2022-05-05 LAB — TSH RFX ON ABNORMAL TO FREE T4: TSH: 1.96 u[IU]/mL (ref 0.450–4.500)

## 2022-05-05 LAB — HIV ANTIBODY (ROUTINE TESTING W REFLEX): HIV Screen 4th Generation wRfx: NONREACTIVE

## 2022-08-03 ENCOUNTER — Ambulatory Visit (HOSPITAL_BASED_OUTPATIENT_CLINIC_OR_DEPARTMENT_OTHER): Payer: 59 | Admitting: Family Medicine

## 2022-09-19 ENCOUNTER — Telehealth (HOSPITAL_BASED_OUTPATIENT_CLINIC_OR_DEPARTMENT_OTHER): Payer: Self-pay | Admitting: Family Medicine

## 2022-09-19 NOTE — Telephone Encounter (Signed)
LVM NEEDS BP RECHECK NURSE VISIT

## 2022-09-21 ENCOUNTER — Telehealth (HOSPITAL_BASED_OUTPATIENT_CLINIC_OR_DEPARTMENT_OTHER): Payer: Self-pay | Admitting: *Deleted

## 2022-09-21 NOTE — Telephone Encounter (Signed)
LVM to reschedule missed bp appt with provider

## 2022-09-26 ENCOUNTER — Telehealth (HOSPITAL_BASED_OUTPATIENT_CLINIC_OR_DEPARTMENT_OTHER): Payer: Self-pay | Admitting: *Deleted

## 2022-09-26 NOTE — Telephone Encounter (Signed)
LVM to schedule bp check

## 2022-12-01 ENCOUNTER — Encounter (HOSPITAL_BASED_OUTPATIENT_CLINIC_OR_DEPARTMENT_OTHER): Payer: Self-pay | Admitting: Family Medicine

## 2023-07-26 ENCOUNTER — Ambulatory Visit (HOSPITAL_COMMUNITY)
Admission: EM | Admit: 2023-07-26 | Discharge: 2023-07-26 | Disposition: A | Discharge status: Home / Self Care | Attending: Internal Medicine | Admitting: Internal Medicine

## 2023-07-26 ENCOUNTER — Other Ambulatory Visit (HOSPITAL_COMMUNITY): Payer: Self-pay

## 2023-07-26 ENCOUNTER — Encounter (HOSPITAL_COMMUNITY): Payer: Self-pay | Admitting: Internal Medicine

## 2023-07-26 DIAGNOSIS — S39012A Strain of muscle, fascia and tendon of lower back, initial encounter: Secondary | ICD-10-CM | POA: Diagnosis not present

## 2023-07-26 MED ORDER — PREDNISONE 10 MG (21) PO TBPK
ORAL_TABLET | ORAL | 0 refills | Status: DC
  Filled 2023-07-26: qty 21, 6d supply, fill #0

## 2023-07-26 MED ORDER — CYCLOBENZAPRINE HCL 5 MG PO TABS
5.0000 mg | ORAL_TABLET | Freq: Every evening | ORAL | 0 refills | Status: DC | PRN
  Filled 2023-07-26: qty 30, 30d supply, fill #0

## 2023-07-26 NOTE — ED Triage Notes (Signed)
 C/O intermittent low back pain onset approx 2 wks ago; states onset occurred while using a new squat machine at the gym. Has been applying cold and hot compresses and taking IBU. Denies any parasthesias. Occasionally pain radiates down one of his lower extremities.

## 2023-07-26 NOTE — ED Provider Notes (Signed)
 [facility]    CSN: [id number] Arrival date & time: 07/26/23  1232      History   Chief Complaint Chief Complaint  Patient presents with   Back Pain    HPI Wesley Watson is a 38 y.o. male presenting today for evaluation of lumbosacral back pain x 2 weeks.  Wesley Watson says he was using a squat machine at his gym when he felt sudden onset pain in his lower back.  He has been using hot/cold compresses and ibuprofen without sustained pain relief.  Denies numbness/weakness in his legs, saddle anesthesia, bowel/bladder incontinence, fever/chills, night sweats, and unintentional weight loss.  He works for [employer] in [employer] and has only been lifting small amounts of weight because of back pain.   Past Medical History:  Diagnosis Date   Hypertension     Patient Active Problem List   Diagnosis Date Noted   Dry skin 05/04/2022   Wellness examination 04/13/2021   Hypertension 03/02/2021    History reviewed. No pertinent surgical history.    Home Medications    Prior to Admission medications   Medication Sig Start Date End Date Taking? Authorizing Provider  amLODipine -valsartan  (EXFORGE ) 5-320 MG tablet Take 1 tablet by mouth daily. 05/04/22  Yes Towana Small, FNP  cyclobenzaprine  (FLEXERIL ) 5 MG tablet Take 1 tablet (5 mg total) by mouth at bedtime as needed for muscle spasms. 07/26/23  Yes Melvenia Manus BRAVO, MD  hydrochlorothiazide  (HYDRODIURIL ) 25 MG tablet Take 1 tablet (25 mg total) by mouth daily. 05/04/22  Yes Towana Small, FNP  predniSONE  (STERAPRED UNI-PAK 21 TAB) 10 MG (21) TBPK tablet Use as directed. 07/26/23  Yes Melvenia Manus BRAVO, MD    Family History Family History  Problem Relation Age of Onset   Hypertension Mother     Social History Social History   Tobacco Use   Smoking status: Never   Smokeless tobacco: Never  Vaping Use   Vaping status: Never Used  Substance Use Topics   Alcohol use: Not Currently    Comment: socially    Drug use: No     Allergies   Patient has no known allergies.   Review of Systems Review of Systems  Musculoskeletal:  Positive for back pain (Lumbosacral back pain).  All other systems reviewed and are negative.    Physical Exam Triage Vital Signs ED Triage Vitals [07/26/23 1246]  Encounter Vitals Group     BP (!) 170/106     Girls Systolic BP Percentile      Girls Diastolic BP Percentile      Boys Systolic BP Percentile      Boys Diastolic BP Percentile      Pulse Rate 92     Resp 16     Temp 98.4 F (36.9 C)     Temp Source Oral     SpO2 97 %     Weight      Height      Head Circumference      Peak Flow      Pain Score 1     Pain Loc      Pain Education      Exclude from Growth Chart    No data found.  Updated Vital Signs BP (!) 170/106 Comment: has not taken HTN meds yet today  Pulse 92   Temp 98.4 F (36.9 C) (Oral)   Resp 16   SpO2 97%   Visual Acuity Right Eye Distance:   Left Eye  Distance:   Bilateral Distance:    Right Eye Near:   Left Eye Near:    Bilateral Near:     Physical Exam Vitals reviewed.  Constitutional:      General: He is not in acute distress.    Appearance: Normal appearance. He is not toxic-appearing.  Musculoskeletal:     Comments: No obvious deformity on inspection of the lumbosacral region of the spine.  There is very mild tenderness to palpation over the paraspinal muscles of the lumbosacral region bilaterally.  Forward flexion at the waist is reduced secondary to pain.  Extension and lateral movements at the waist are generally intact, though he endorses pain with lateral movements to the left.  Negative SLR bilaterally.  The lower extremities are neurovascularly intact.  Neurological:     Mental Status: He is alert.     UC Treatments / Results  Labs (all labs ordered are listed, but only abnormal results are displayed) Labs Reviewed - No data to display  EKG   Radiology No results  found.  Procedures Procedures (including critical care time)  Medications Ordered in UC Medications - No data to display  Initial Impression / Assessment and Plan / UC Course  I have reviewed the triage vital signs and the nursing notes.  Pertinent labs & imaging results that were available during my care of the patient were reviewed by me and considered in my medical decision making (see chart for details).  38 year old male presenting today for evaluation of low back pain x 2 weeks.  He was using a squat machine at his gym when he felt sudden onset of pain.  Currently using NSAIDs and hot/cold compresses without sustained pain relief.  Pain has not worsened but has not significantly improved.  No red flag symptoms identified.  On exam forward flexion is limited secondary to pain.  His exam is otherwise benign.    History and exam findings today are concerning for lumbar strain.  Treatment options reviewed.  I have prescribed a prednisone  taper and Flexeril  for symptom relief.  He has also been provided with home PT exercises for rehabilitation and strengthening of the paraspinal muscles in the lumbosacral region.  He can continue to work with light duty, which has already been permitted by his supervisor.  He was instructed to return to care if symptoms worsen or fail to improve.  Disposition: Stable for discharge home   Final Clinical Impressions(s) / UC Diagnoses   Final diagnoses:  Lumbar strain, initial encounter     Discharge Instructions      I suspect that your pain is due to a muscle strain in your lower back. As we discussed, I have prescribed a short course of steroids and a muscle relaxer. OK to continue hot/cold compresses and as needed use of ibuprofen. Please see the attached exercises for your back, as they will assist in rehab. Continue light duty at work and gradually increase the amounts you lift as your pain improves. Return to care if your pain worsens or does not  improve.     ED Prescriptions     Medication Sig Dispense Auth. Provider   predniSONE  (STERAPRED UNI-PAK 21 TAB) 10 MG (21) TBPK tablet Use as directed. 21 each Melvenia Manus BRAVO, MD   cyclobenzaprine  (FLEXERIL ) 5 MG tablet Take 1 tablet (5 mg total) by mouth at bedtime as needed for muscle spasms. 30 tablet Yates Weisgerber E, MD      I have reviewed the PDMP during this  encounter.   Melvenia Manus BRAVO, MD 07/26/23 1311

## 2023-07-26 NOTE — Discharge Instructions (Signed)
 I suspect that your pain is due to a muscle strain in your lower back. As we discussed, I have prescribed a short course of steroids and a muscle relaxer. OK to continue hot/cold compresses and as needed use of ibuprofen. Please see the attached exercises for your back, as they will assist in rehab. Continue light duty at work and gradually increase the amounts you lift as your pain improves. Return to care if your pain worsens or does not improve.

## 2023-12-26 ENCOUNTER — Ambulatory Visit (INDEPENDENT_AMBULATORY_CARE_PROVIDER_SITE_OTHER): Admitting: Family Medicine

## 2023-12-26 ENCOUNTER — Other Ambulatory Visit (HOSPITAL_BASED_OUTPATIENT_CLINIC_OR_DEPARTMENT_OTHER): Payer: Self-pay

## 2023-12-26 ENCOUNTER — Encounter (HOSPITAL_BASED_OUTPATIENT_CLINIC_OR_DEPARTMENT_OTHER): Payer: Self-pay | Admitting: Family Medicine

## 2023-12-26 VITALS — BP 183/123 | HR 73 | Temp 98.0°F | Resp 18 | Ht 70.0 in | Wt 288.0 lb

## 2023-12-26 DIAGNOSIS — J302 Other seasonal allergic rhinitis: Secondary | ICD-10-CM | POA: Insufficient documentation

## 2023-12-26 DIAGNOSIS — F3340 Major depressive disorder, recurrent, in remission, unspecified: Secondary | ICD-10-CM | POA: Insufficient documentation

## 2023-12-26 DIAGNOSIS — F411 Generalized anxiety disorder: Secondary | ICD-10-CM | POA: Insufficient documentation

## 2023-12-26 DIAGNOSIS — I10 Essential (primary) hypertension: Secondary | ICD-10-CM

## 2023-12-26 DIAGNOSIS — R7303 Prediabetes: Secondary | ICD-10-CM | POA: Insufficient documentation

## 2023-12-26 MED ORDER — AMLODIPINE BESYLATE-VALSARTAN 5-320 MG PO TABS
1.0000 | ORAL_TABLET | Freq: Every day | ORAL | 1 refills | Status: DC
  Filled 2023-12-26: qty 90, 90d supply, fill #0

## 2023-12-26 MED ORDER — AMLODIPINE BESYLATE-VALSARTAN 5-160 MG PO TABS
1.0000 | ORAL_TABLET | Freq: Every day | ORAL | 2 refills | Status: AC
  Filled 2023-12-26 (×2): qty 30, 30d supply, fill #0
  Filled 2024-01-28: qty 30, 30d supply, fill #1

## 2023-12-26 NOTE — Progress Notes (Signed)
° ° °  Procedures performed today:    None.  Independent interpretation of notes and tests performed by another provider:   None.  Brief History, Exam, Impression, and Recommendations:    BP (!) 183/123 (BP Location: Left Arm, Patient Position: Sitting, Cuff Size: Large)   Pulse 73   Temp 98 F (36.7 C) (Oral)   Resp 18   Ht 5' 10 (1.778 m)   Wt 288 lb (130.6 kg)   SpO2 100%   BMI 41.32 kg/m   Primary hypertension Assessment & Plan: Has been without medication for about 2 months - reports this was related to financial reasons.  He denies having any symptoms such as chest pain or headaches.  Has not been checking blood pressure regularly.  Still trying to stay active and focus on lifestyle modifications.  Previously, he was taking amlodipine -valsartan  as well as hydrochlorothiazide  and did not have any side effects with these medications. We discussed considerations, certainly with blood pressure being very elevated, would need to resume blood pressure medications.  Will start with lower dose than what he has been on in the past with plan for gradual titration depending on blood pressure response.  At this time, we will start with amlodipine -valsartan  5-160 mg.  Recommend intermittent monitoring of blood pressure at home, DASH diet.  Will plan for close follow-up in about 2 weeks to monitor progress and we will check labs at that time, including BMP.   Prediabetes Assessment & Plan: Noted on prior labs, was just above normal within prediabetes range at 5.7%.  He still has been focusing on lifestyle modifications. Will plan to recheck A1c with labs at time of physical.   Other orders -     amLODIPine  Besylate-Valsartan ; Take 1 tablet by mouth daily.  Dispense: 30 tablet; Refill: 2  Return in about 2 weeks (around 01/09/2024) for hypertension, med check.  He will likely plan to arrange for CPE after next appointment with more complete blood work.  This will be dependent upon  response to medication changes  Spent 32 minutes on this patient encounter, including preparation, chart review, face-to-face counseling with patient and coordination of care, and documentation of encounter   ___________________________________________ Wesley Brallier de [place], MD, ABFM, CAQSM Primary Care and Sports Medicine [facility]

## 2023-12-26 NOTE — Assessment & Plan Note (Signed)
 Has been without medication for about 2 months - reports this was related to financial reasons.  He denies having any symptoms such as chest pain or headaches.  Has not been checking blood pressure regularly.  Still trying to stay active and focus on lifestyle modifications.  Previously, he was taking amlodipine -valsartan  as well as hydrochlorothiazide  and did not have any side effects with these medications. We discussed considerations, certainly with blood pressure being very elevated, would need to resume blood pressure medications.  Will start with lower dose than what he has been on in the past with plan for gradual titration depending on blood pressure response.  At this time, we will start with amlodipine -valsartan  5-160 mg.  Recommend intermittent monitoring of blood pressure at home, DASH diet.  Will plan for close follow-up in about 2 weeks to monitor progress and we will check labs at that time, including BMP.

## 2023-12-26 NOTE — Assessment & Plan Note (Signed)
 Noted on prior labs, was just above normal within prediabetes range at 5.7%.  He still has been focusing on lifestyle modifications. Will plan to recheck A1c with labs at time of physical.

## 2024-01-28 ENCOUNTER — Other Ambulatory Visit (HOSPITAL_BASED_OUTPATIENT_CLINIC_OR_DEPARTMENT_OTHER): Payer: Self-pay

## 2024-01-29 ENCOUNTER — Other Ambulatory Visit (HOSPITAL_BASED_OUTPATIENT_CLINIC_OR_DEPARTMENT_OTHER): Payer: Self-pay

## 2024-01-29 ENCOUNTER — Ambulatory Visit (HOSPITAL_BASED_OUTPATIENT_CLINIC_OR_DEPARTMENT_OTHER): Admitting: Family Medicine

## 2024-02-01 ENCOUNTER — Ambulatory Visit (INDEPENDENT_AMBULATORY_CARE_PROVIDER_SITE_OTHER): Admitting: Family Medicine

## 2024-02-01 ENCOUNTER — Other Ambulatory Visit (HOSPITAL_BASED_OUTPATIENT_CLINIC_OR_DEPARTMENT_OTHER): Payer: Self-pay

## 2024-02-01 ENCOUNTER — Encounter (HOSPITAL_BASED_OUTPATIENT_CLINIC_OR_DEPARTMENT_OTHER): Payer: Self-pay | Admitting: Family Medicine

## 2024-02-01 VITALS — BP 141/85 | HR 75 | Temp 97.8°F | Resp 18 | Ht 70.0 in | Wt 286.0 lb

## 2024-02-01 DIAGNOSIS — I10 Essential (primary) hypertension: Secondary | ICD-10-CM

## 2024-02-01 DIAGNOSIS — Z0000 Encounter for general adult medical examination without abnormal findings: Secondary | ICD-10-CM

## 2024-02-01 LAB — BASIC METABOLIC PANEL WITH GFR
BUN/Creatinine Ratio: 12 (ref 9–20)
BUN: 13 mg/dL (ref 6–20)
CO2: 26 mmol/L (ref 20–29)
Calcium: 9.8 mg/dL (ref 8.7–10.2)
Chloride: 100 mmol/L (ref 96–106)
Creatinine, Ser: 1.09 mg/dL (ref 0.76–1.27)
Glucose: 86 mg/dL (ref 70–99)
Potassium: 4.5 mmol/L (ref 3.5–5.2)
Sodium: 140 mmol/L (ref 134–144)
eGFR: 90 mL/min/1.73

## 2024-02-01 MED ORDER — VALSARTAN 160 MG PO TABS
160.0000 mg | ORAL_TABLET | Freq: Every day | ORAL | 0 refills | Status: AC
  Filled 2024-02-01: qty 30, 30d supply, fill #0

## 2024-02-01 NOTE — Progress Notes (Signed)
" ° ° °  Procedures performed today:    None.  Independent interpretation of notes and tests performed by another provider:   None.  Brief History, Exam, Impression, and Recommendations:    BP (!) 141/85 (BP Location: Left Arm, Patient Position: Sitting, Cuff Size: Large)   Pulse 75   Temp 97.8 F (36.6 C) (Oral)   Resp 18   Ht 5' 10 (1.778 m)   Wt 286 lb (129.7 kg)   SpO2 100%   BMI 41.04 kg/m   Primary hypertension Assessment & Plan: Blood pressure is improved in office today compared to last visit.  He has been taking medication as prescribed.  Not checking blood pressure at home.  No current issues with chest pain or headaches. We discussed considerations, would be reasonable to adjust dose of current medication.  He previously was taking amlodipine -valsartan  5-320 mg.  We can look to increase to this dose. He did just pick up prescription at current dose, thus I will send prescription for valsartan  160 mg to take with current medication.  Once he finishes up this month supply, he will let us  know and we can send in for higher dose combination pill We will check BMP today. Recommend intermittent monitoring of blood pressure home, DASH diet  Orders: -     Basic metabolic panel with GFR  Wellness examination -     CBC with Differential/Platelet; Future -     Comprehensive metabolic panel with GFR; Future -     Hemoglobin A1c; Future -     Lipid panel; Future -     TSH Rfx on Abnormal to Free T4; Future  Other orders -     Valsartan ; Take 1 tablet (160 mg total) by mouth daily. Take with amlodipine -valsartan  5-160 to achieve total dose of valsartan  320 mg.  Dispense: 30 tablet; Refill: 0  Return in about 2 months (around 03/31/2024) for CPE with fasting labs 1 week prior.   ___________________________________________ Tenika Keeran de [place], MD, ABFM, CAQSM Primary Care and Sports Medicine [facility] "

## 2024-02-01 NOTE — Assessment & Plan Note (Signed)
 Blood pressure is improved in office today compared to last visit.  He has been taking medication as prescribed.  Not checking blood pressure at home.  No current issues with chest pain or headaches. We discussed considerations, would be reasonable to adjust dose of current medication.  He previously was taking amlodipine -valsartan  5-320 mg.  We can look to increase to this dose. He did just pick up prescription at current dose, thus I will send prescription for valsartan  160 mg to take with current medication.  Once he finishes up this month supply, he will let us  know and we can send in for higher dose combination pill We will check BMP today. Recommend intermittent monitoring of blood pressure home, DASH diet

## 2024-02-14 ENCOUNTER — Ambulatory Visit (HOSPITAL_BASED_OUTPATIENT_CLINIC_OR_DEPARTMENT_OTHER): Payer: Self-pay | Admitting: Family Medicine

## 2024-04-03 ENCOUNTER — Encounter (HOSPITAL_BASED_OUTPATIENT_CLINIC_OR_DEPARTMENT_OTHER): Admitting: Family Medicine
